# Patient Record
Sex: Female | Born: 1965 | Race: White | Hispanic: No | Marital: Single | Smoking: Former smoker
Health system: Southern US, Community
[De-identification: ages and names within clinical notes are randomized; demographics above are authoritative.]

## PROBLEM LIST (undated history)

## (undated) DIAGNOSIS — E119 Type 2 diabetes mellitus without complications: Secondary | ICD-10-CM

## (undated) DIAGNOSIS — I10 Essential (primary) hypertension: Secondary | ICD-10-CM

## (undated) DIAGNOSIS — N289 Disorder of kidney and ureter, unspecified: Secondary | ICD-10-CM

---

## 2017-04-16 ENCOUNTER — Inpatient Hospital Stay (HOSPITAL_BASED_OUTPATIENT_CLINIC_OR_DEPARTMENT_OTHER)
Admission: EM | Admit: 2017-04-16 | Discharge: 2017-04-22 | DRG: 854 | Disposition: A | Discharge status: Home Health | Payer: Medicaid Other | Attending: Internal Medicine | Admitting: Internal Medicine

## 2017-04-16 ENCOUNTER — Emergency Department (HOSPITAL_BASED_OUTPATIENT_CLINIC_OR_DEPARTMENT_OTHER): Payer: Medicaid Other

## 2017-04-16 ENCOUNTER — Inpatient Hospital Stay (HOSPITAL_BASED_OUTPATIENT_CLINIC_OR_DEPARTMENT_OTHER): Payer: Medicaid Other

## 2017-04-16 ENCOUNTER — Encounter (HOSPITAL_BASED_OUTPATIENT_CLINIC_OR_DEPARTMENT_OTHER): Payer: Self-pay | Admitting: Emergency Medicine

## 2017-04-16 DIAGNOSIS — F339 Major depressive disorder, recurrent, unspecified: Secondary | ICD-10-CM | POA: Diagnosis present

## 2017-04-16 DIAGNOSIS — K580 Irritable bowel syndrome with diarrhea: Secondary | ICD-10-CM | POA: Diagnosis present

## 2017-04-16 DIAGNOSIS — R197 Diarrhea, unspecified: Secondary | ICD-10-CM | POA: Diagnosis not present

## 2017-04-16 DIAGNOSIS — D6859 Other primary thrombophilia: Secondary | ICD-10-CM | POA: Diagnosis present

## 2017-04-16 DIAGNOSIS — L03111 Cellulitis of right axilla: Secondary | ICD-10-CM | POA: Diagnosis present

## 2017-04-16 DIAGNOSIS — N611 Abscess of the breast and nipple: Secondary | ICD-10-CM | POA: Diagnosis present

## 2017-04-16 DIAGNOSIS — Z1639 Resistance to other specified antimicrobial drug: Secondary | ICD-10-CM | POA: Diagnosis present

## 2017-04-16 DIAGNOSIS — N179 Acute kidney failure, unspecified: Secondary | ICD-10-CM | POA: Diagnosis present

## 2017-04-16 DIAGNOSIS — E876 Hypokalemia: Secondary | ICD-10-CM | POA: Diagnosis not present

## 2017-04-16 DIAGNOSIS — F410 Panic disorder [episodic paroxysmal anxiety] without agoraphobia: Secondary | ICD-10-CM | POA: Diagnosis present

## 2017-04-16 DIAGNOSIS — A419 Sepsis, unspecified organism: Secondary | ICD-10-CM

## 2017-04-16 DIAGNOSIS — Z79899 Other long term (current) drug therapy: Secondary | ICD-10-CM

## 2017-04-16 DIAGNOSIS — N048 Nephrotic syndrome with other morphologic changes: Secondary | ICD-10-CM | POA: Diagnosis not present

## 2017-04-16 DIAGNOSIS — Z87441 Personal history of nephrotic syndrome: Secondary | ICD-10-CM | POA: Diagnosis not present

## 2017-04-16 DIAGNOSIS — Z7901 Long term (current) use of anticoagulants: Secondary | ICD-10-CM

## 2017-04-16 DIAGNOSIS — I10 Essential (primary) hypertension: Secondary | ICD-10-CM | POA: Diagnosis present

## 2017-04-16 DIAGNOSIS — E8809 Other disorders of plasma-protein metabolism, not elsewhere classified: Secondary | ICD-10-CM | POA: Diagnosis present

## 2017-04-16 DIAGNOSIS — Z7984 Long term (current) use of oral hypoglycemic drugs: Secondary | ICD-10-CM

## 2017-04-16 DIAGNOSIS — L02411 Cutaneous abscess of right axilla: Secondary | ICD-10-CM | POA: Diagnosis present

## 2017-04-16 DIAGNOSIS — N049 Nephrotic syndrome with unspecified morphologic changes: Secondary | ICD-10-CM | POA: Diagnosis not present

## 2017-04-16 DIAGNOSIS — Z87891 Personal history of nicotine dependence: Secondary | ICD-10-CM

## 2017-04-16 DIAGNOSIS — N052 Unspecified nephritic syndrome with diffuse membranous glomerulonephritis: Secondary | ICD-10-CM | POA: Diagnosis present

## 2017-04-16 DIAGNOSIS — E1122 Type 2 diabetes mellitus with diabetic chronic kidney disease: Secondary | ICD-10-CM | POA: Diagnosis present

## 2017-04-16 DIAGNOSIS — N61 Mastitis without abscess: Secondary | ICD-10-CM | POA: Diagnosis not present

## 2017-04-16 DIAGNOSIS — L0390 Cellulitis, unspecified: Secondary | ICD-10-CM

## 2017-04-16 DIAGNOSIS — G4700 Insomnia, unspecified: Secondary | ICD-10-CM | POA: Diagnosis present

## 2017-04-16 DIAGNOSIS — A4102 Sepsis due to Methicillin resistant Staphylococcus aureus: Principal | ICD-10-CM | POA: Diagnosis present

## 2017-04-16 HISTORY — DX: Essential (primary) hypertension: I10

## 2017-04-16 HISTORY — DX: Disorder of kidney and ureter, unspecified: N28.9

## 2017-04-16 HISTORY — DX: Type 2 diabetes mellitus without complications: E11.9

## 2017-04-16 LAB — COMPREHENSIVE METABOLIC PANEL
ALT: 18 U/L (ref 14–54)
AST: 20 U/L (ref 15–41)
Albumin: 1.1 g/dL — ABNORMAL LOW (ref 3.5–5.0)
Alkaline Phosphatase: 109 U/L (ref 38–126)
Anion gap: 7 (ref 5–15)
BILIRUBIN TOTAL: 0.9 mg/dL (ref 0.3–1.2)
BUN: 37 mg/dL — ABNORMAL HIGH (ref 6–20)
CHLORIDE: 104 mmol/L (ref 101–111)
CO2: 27 mmol/L (ref 22–32)
CREATININE: 1.9 mg/dL — AB (ref 0.44–1.00)
Calcium: 7.5 mg/dL — ABNORMAL LOW (ref 8.9–10.3)
GFR, EST AFRICAN AMERICAN: 34 mL/min — AB (ref >60)
GFR, EST NON AFRICAN AMERICAN: 29 mL/min — AB (ref >60)
Glucose, Bld: 155 mg/dL — ABNORMAL HIGH (ref 65–99)
POTASSIUM: 3.5 mmol/L (ref 3.5–5.1)
Sodium: 138 mmol/L (ref 135–145)
TOTAL PROTEIN: 5.1 g/dL — AB (ref 6.5–8.1)

## 2017-04-16 LAB — PROTIME-INR
INR: 2.75
Prothrombin Time: 28.9 seconds — ABNORMAL HIGH (ref 11.4–15.2)

## 2017-04-16 LAB — CBC WITH DIFFERENTIAL/PLATELET
BAND NEUTROPHILS: 5 %
BASOS ABS: 0.1 10*3/uL (ref 0.0–0.1)
BASOS PCT: 1 %
EOS PCT: 3 %
Eosinophils Absolute: 0.4 10*3/uL (ref 0.0–0.7)
HEMATOCRIT: 40.1 % (ref 36.0–46.0)
HEMOGLOBIN: 13.3 g/dL (ref 12.0–15.0)
Lymphocytes Relative: 10 %
Lymphs Abs: 1.3 10*3/uL (ref 0.7–4.0)
MCH: 29.8 pg (ref 26.0–34.0)
MCHC: 33.2 g/dL (ref 30.0–36.0)
MCV: 89.9 fL (ref 78.0–100.0)
METAMYELOCYTES PCT: 1 %
MONOS PCT: 5 %
Monocytes Absolute: 0.6 10*3/uL (ref 0.1–1.0)
Myelocytes: 1 %
NEUTROS PCT: 74 %
Neutro Abs: 10.4 10*3/uL — ABNORMAL HIGH (ref 1.7–7.7)
PLATELETS: 253 10*3/uL (ref 150–400)
RBC: 4.46 MIL/uL (ref 3.87–5.11)
RDW: 16.3 % — ABNORMAL HIGH (ref 11.5–15.5)
WBC: 12.8 10*3/uL — AB (ref 4.0–10.5)

## 2017-04-16 LAB — GLUCOSE, CAPILLARY: GLUCOSE-CAPILLARY: 108 mg/dL — AB (ref 65–99)

## 2017-04-16 LAB — I-STAT CG4 LACTIC ACID, ED: LACTIC ACID, VENOUS: 0.97 mmol/L (ref 0.5–1.9)

## 2017-04-16 MED ORDER — PREDNISONE 10 MG PO TABS
10.0000 mg | ORAL_TABLET | Freq: Every day | ORAL | Status: DC
  Administered 2017-04-17 – 2017-04-22 (×5): 10 mg via ORAL
  Filled 2017-04-16 (×5): qty 1

## 2017-04-16 MED ORDER — DICYCLOMINE HCL 10 MG PO CAPS
10.0000 mg | ORAL_CAPSULE | Freq: Three times a day (TID) | ORAL | Status: DC
  Administered 2017-04-16 – 2017-04-21 (×16): 10 mg via ORAL
  Filled 2017-04-16 (×20): qty 1

## 2017-04-16 MED ORDER — TORSEMIDE 20 MG PO TABS: 20.0000 mg | ORAL_TABLET | Freq: Two times a day (BID) | ORAL | Status: DC

## 2017-04-16 MED ORDER — SPIRONOLACTONE 25 MG PO TABS: 25.0000 mg | ORAL_TABLET | Freq: Every day | ORAL | Status: DC

## 2017-04-16 MED ORDER — HYDROMORPHONE HCL 1 MG/ML IJ SOLN: 0.5000 mg | INTRAMUSCULAR | Status: DC | PRN

## 2017-04-16 MED ORDER — VANCOMYCIN HCL IN DEXTROSE 1-5 GM/200ML-% IV SOLN
1000.0000 mg | Freq: Once | INTRAVENOUS | Status: AC
  Administered 2017-04-16: 1000 mg via INTRAVENOUS
  Filled 2017-04-16: qty 200

## 2017-04-16 MED ORDER — VANCOMYCIN HCL IN DEXTROSE 1-5 GM/200ML-% IV SOLN
1000.0000 mg | INTRAVENOUS | Status: DC
  Administered 2017-04-17 – 2017-04-19 (×3): 1000 mg via INTRAVENOUS
  Filled 2017-04-16 (×4): qty 200

## 2017-04-16 MED ORDER — INSULIN ASPART 100 UNIT/ML ~~LOC~~ SOLN
0.0000 [IU] | Freq: Three times a day (TID) | SUBCUTANEOUS | Status: DC
  Administered 2017-04-17: 2 [IU] via SUBCUTANEOUS
  Administered 2017-04-17: 1 [IU] via SUBCUTANEOUS
  Administered 2017-04-18: 3 [IU] via SUBCUTANEOUS
  Administered 2017-04-19: 2 [IU] via SUBCUTANEOUS
  Administered 2017-04-19: 1 [IU] via SUBCUTANEOUS
  Administered 2017-04-19 – 2017-04-20 (×2): 2 [IU] via SUBCUTANEOUS
  Administered 2017-04-20: 1 [IU] via SUBCUTANEOUS
  Administered 2017-04-21: 2 [IU] via SUBCUTANEOUS
  Administered 2017-04-21 – 2017-04-22 (×2): 1 [IU] via SUBCUTANEOUS

## 2017-04-16 MED ORDER — ONDANSETRON HCL 4 MG PO TABS: 4.0000 mg | ORAL_TABLET | Freq: Four times a day (QID) | ORAL | Status: DC | PRN

## 2017-04-16 MED ORDER — ACETAMINOPHEN 500 MG PO TABS
1000.0000 mg | ORAL_TABLET | Freq: Once | ORAL | Status: AC
  Administered 2017-04-16: 1000 mg via ORAL
  Filled 2017-04-16: qty 2

## 2017-04-16 MED ORDER — ONDANSETRON HCL 4 MG/2ML IJ SOLN: 4.0000 mg | Freq: Four times a day (QID) | INTRAMUSCULAR | Status: DC | PRN

## 2017-04-16 MED ORDER — ACETAMINOPHEN 650 MG RE SUPP: 650.0000 mg | Freq: Four times a day (QID) | RECTAL | Status: DC | PRN

## 2017-04-16 MED ORDER — INSULIN ASPART 100 UNIT/ML ~~LOC~~ SOLN: 0.0000 [IU] | Freq: Every day | SUBCUTANEOUS | Status: DC

## 2017-04-16 MED ORDER — ACETAMINOPHEN 325 MG PO TABS
650.0000 mg | ORAL_TABLET | Freq: Four times a day (QID) | ORAL | Status: DC | PRN
  Administered 2017-04-20 – 2017-04-22 (×3): 650 mg via ORAL
  Filled 2017-04-16 (×3): qty 2

## 2017-04-16 MED ORDER — SODIUM CHLORIDE 0.9% FLUSH
3.0000 mL | Freq: Two times a day (BID) | INTRAVENOUS | Status: DC
  Administered 2017-04-16 – 2017-04-22 (×11): 3 mL via INTRAVENOUS

## 2017-04-16 MED ORDER — TACROLIMUS 1 MG PO CAPS
1.0000 mg | ORAL_CAPSULE | Freq: Two times a day (BID) | ORAL | Status: DC
  Administered 2017-04-16 – 2017-04-17 (×2): 1 mg via ORAL
  Filled 2017-04-16 (×3): qty 1

## 2017-04-16 MED ORDER — HYDROCODONE-ACETAMINOPHEN 5-325 MG PO TABS: 1.0000 | ORAL_TABLET | ORAL | Status: DC | PRN

## 2017-04-16 MED ORDER — QUETIAPINE FUMARATE 25 MG PO TABS
25.0000 mg | ORAL_TABLET | Freq: Every day | ORAL | Status: DC
  Administered 2017-04-16 – 2017-04-21 (×6): 25 mg via ORAL
  Filled 2017-04-16 (×6): qty 1

## 2017-04-16 MED ORDER — FLUOXETINE HCL 10 MG PO CAPS
10.0000 mg | ORAL_CAPSULE | Freq: Every day | ORAL | Status: DC
  Administered 2017-04-17 – 2017-04-22 (×5): 10 mg via ORAL
  Filled 2017-04-16 (×6): qty 1

## 2017-04-16 MED ORDER — HYDROCODONE-ACETAMINOPHEN 5-325 MG PO TABS
1.0000 | ORAL_TABLET | ORAL | Status: DC | PRN
  Administered 2017-04-16 – 2017-04-20 (×10): 1 via ORAL
  Administered 2017-04-21: 2 via ORAL
  Filled 2017-04-16 (×4): qty 1
  Filled 2017-04-16: qty 2
  Filled 2017-04-16 (×6): qty 1

## 2017-04-16 NOTE — Progress Notes (Signed)
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   Medical History: Past Medical History:  Diagnosis Date  . Diabetes mellitus without complication (HCC)   . Hypertension   . Renal disorder     [name] presents to the ED with right breast cellulitis, on warfarin PTA for hypercoagulable state, INR is therapeutic on admit at 2.75, CBC good.   Warfarin PTA dosing: 5 mg daily   Goal of Therapy:  INR 2-3 Monitor platelets by anticoagulation protocol: Yes   Plan:  Warfarin 5 mg daily at 1800 Daily PT/INR Monitor for bleeding   Abran DukeLedford, Lenord Fralix 04/16/2017,11:46 PM

## 2017-04-16 NOTE — ED Provider Notes (Signed)
[facility] EMERGENCY DEPARTMENT Provider Note   CSN: [id number] Arrival date & time: 04/16/17  1549     History   Chief Complaint Chief Complaint  Patient presents with  . Abscess  . Fever    HPI Oreoluwa Gilmer is a 51 y.o. female.  Patient is a 51 year old female with multiple medical problems including recent diagnosis of nephrotic syndrome on immunosuppressants and steroids, diabetes, anasarca, chronic diarrhea who was recently admitted to [facility] and discharged only a few days ago presenting today with severe pain in her right breast, redness and drainage.  Patient states while hospitalized she had weeping sores on the upper portion of her right breast but the significant pain swelling and redness started since being home.  She is also running a fever today but is unclear how long that has been happening.  She denies any shortness of breath but states it is very difficult for her to get around because of the excessive weight from the generalized edema and intermittently friends states she will be confused.  She denies any urinary complaints but states that she does not urinate as much as normal because she is having excessive watery diarrhea which she has almost hourly.  While hospitalized diarrhea was addressed and it was thought to be related to the cyclosporine and magnesium oxide she was taking so she was changed to Prograft however that has not seemed to improve her diarrhea.  She also tested negative for C. difficile.   The history is provided by the patient and a friend.  Abscess  Location:  Torso Torso abscess location:  R breast Size:  Large Abscess quality: draining, painful, redness, warmth and weeping   Red streaking: yes   Duration:  3 days Progression:  Worsening Pain details:    Quality:  Sharp and shooting   Severity:  Severe   Timing:  Constant   Progression:  Worsening Chronicity:  New Context: diabetes and immunosuppression     Relieved by:  Nothing Associated symptoms: fatigue and fever   Associated symptoms: no nausea and no vomiting   Fever   Pertinent negatives include no vomiting.    No past medical history on file.  Patient Active Problem List   Diagnosis Date Noted  . Breast abscess 04/16/2017    No past surgical history on file.  OB History    No data available       Home Medications    Prior to Admission medications   Medication Sig Start Date End Date Taking? Authorizing Provider  dicyclomine (BENTYL) 10 MG capsule Take 10 mg by mouth 4 (four) times daily -  before meals and at bedtime.   Yes [provider]  FLUoxetine (PROZAC) 10 MG capsule Take 10 mg by mouth daily.   Yes [provider]  glipiZIDE (GLUCOTROL) 5 MG tablet Take 5 mg by mouth daily before breakfast.   Yes [provider]  losartan (COZAAR) 50 MG tablet Take 50 mg by mouth daily.   Yes [provider]  ondansetron (ZOFRAN) 4 MG tablet Take 4 mg by mouth every 8 (eight) hours as needed for nausea or vomiting.   Yes [provider]  predniSONE (DELTASONE) 50 MG tablet Take 10 mg by mouth daily with breakfast.   Yes [provider]  QUEtiapine (SEROQUEL) 25 MG tablet Take 25 mg by mouth at bedtime.   Yes [provider]  spironolactone (ALDACTONE) 25 MG tablet Take 25 mg by mouth daily.  Yes [provider]  tacrolimus (PROGRAF) 1 MG capsule Take 1 mg by mouth 2 (two) times daily.   Yes [provider]  torsemide (DEMADEX) 20 MG tablet Take 20 mg by mouth 2 (two) times daily.   Yes [provider]  warfarin (COUMADIN) 5 MG tablet Take 5 mg by mouth daily.   Yes [provider]    Family History No family history on file.  Social History Social History  Substance Use Topics  . Smoking status: Not on file  . Smokeless tobacco: Not on file  . Alcohol use Not on file     Allergies   Patient has no known  allergies.   Review of Systems Review of Systems  Constitutional: Positive for fatigue and fever.  Gastrointestinal: Negative for nausea and vomiting.  All other systems reviewed and are negative.    Physical Exam Updated Vital Signs BP (!) 152/81   Pulse 94   Temp 100.3 F (37.9 C) (Oral)   Resp 20   Ht 5\' 4"  (1.626 m)   Wt 105.2 kg (232 lb)   SpO2 97%   BMI 39.82 kg/m   Physical Exam  Constitutional: She is oriented to person, place, and time. She appears well-developed and well-nourished. No distress.  HENT:  Head: Normocephalic and atraumatic.  Mouth/Throat: Oropharynx is clear and moist. Mucous membranes are dry.  Eyes: Pupils are equal, round, and reactive to light. Conjunctivae and EOM are normal.  Neck: Normal range of motion. Neck supple.  Cardiovascular: Normal rate, regular rhythm and intact distal pulses.   No murmur heard. Pulmonary/Chest: Effort normal. No respiratory distress. She has decreased breath sounds in the right lower field and the left lower field. She has no wheezes. She has no rales.  Abdominal: Soft. She exhibits no distension. There is no tenderness. There is no rebound and no guarding.  Musculoskeletal: Normal range of motion. She exhibits edema. She exhibits no tenderness.  Diffuse anasarca present in the upper and lower extremities and abdomen  Neurological: She is alert and oriented to person, place, and time.  Skin: Skin is warm and dry. No rash noted. No erythema.  Large abscess present over the upper portion of the right breast with surrounding erythema, warmth.  No drainage present.  Patient also has small pustules and vesicles in the same area concerning for potential shingles.  Nothing present on the left side or the back.  Psychiatric: She has a normal mood and affect. Her behavior is normal.  Nursing note and vitals reviewed.    ED Treatments / Results  Labs (all labs ordered are listed, but only abnormal results are  displayed) Labs Reviewed  COMPREHENSIVE METABOLIC PANEL - Abnormal; Notable for the following:       Result Value   Glucose, Bld 155 (*)    BUN 37 (*)    Creatinine, Ser 1.90 (*)    Calcium 7.5 (*)    Total Protein 5.1 (*)    Albumin 1.1 (*)    GFR calc non Af Amer 29 (*)    GFR calc Af Amer 34 (*)    All other components within normal limits  CBC WITH DIFFERENTIAL/PLATELET - Abnormal; Notable for the following:    WBC 12.8 (*)    RDW 16.3 (*)    Neutro Abs 10.4 (*)    All other components within normal limits  CULTURE, BLOOD (ROUTINE X 2)  CULTURE, BLOOD (ROUTINE X 2)  URINALYSIS, ROUTINE W REFLEX MICROSCOPIC  I-STAT  CG4 LACTIC ACID, ED  I-STAT CG4 LACTIC ACID, ED    EKG  EKG Interpretation None       Radiology Dg Chest 2 View  Result Date: 04/16/2017 CLINICAL DATA:  Fever with abscess to the right axilla EXAM: CHEST  2 VIEW COMPARISON:  None. FINDINGS: Small left greater than right pleural effusion. Hazy atelectasis or infiltrate at the left base. Normal heart size. No pneumothorax. Old right fifth rib fracture IMPRESSION: Small left greater than right pleural effusion with left basilar atelectasis or infiltrate Electronically Signed   By: Jasmine PangKim  Fujinaga M.D.   On: 04/16/2017 17:10    Procedures Procedures (including critical care time)  Medications Ordered in ED Medications  vancomycin (VANCOCIN) IVPB 1000 mg/200 mL premix (1,000 mg Intravenous New Bag/Given 04/16/17 1832)     Initial Impression / Assessment and Plan / ED Course  I have reviewed the triage vital signs and the nursing notes.  Pertinent labs & imaging results that were available during my care of the patient were reviewed by me and considered in my medical decision making (see chart for details).     Patient presenting with complex medical history who is now here with a breast abscess and cellulitis.  Septic workup initiated.  She is lactic acid is within normal limits.  Leukocytosis of 12.8,  new acute kidney injury on top of chronic kidney disease with creatinine of 1.9 from her baseline of 1.1.  Chest x-rays showing small pleural effusions with some atelectasis.  Given the size and extent of the breast abscess and cellulitis patient started on vancomycin.  Spoke with Dr. Derrell Lollingamirez who recommended a noncontrast CAT scan to get further formation about the size of the abscess.  Spoke with Dr. Antionette Charpyd for admission.  Final Clinical Impressions(s) / ED Diagnoses   Final diagnoses:  Breast abscess  Cellulitis, unspecified cellulitis site  Sepsis, due to unspecified organism (HCC)  Acute kidney injury (HCC)  Nephrotic syndrome    New Prescriptions New Prescriptions   No medications on file     Gwyneth SproutPlunkett, Willies Laviolette, MD 04/16/17 1952

## 2017-04-16 NOTE — ED Notes (Signed)
Pt unable to provide urine sample at this time.  Pt on automatic VS.

## 2017-04-16 NOTE — Consult Note (Signed)
Reason for Consult: Right breast abscess Referring Physician: Dr. [name] is an 51 y.o. female.  HPI: Patient is a 51 year old female with a past medical history is significant for hypertension, diabetes, renal disorder on chronic immunosuppression. Patient states that recently she was discharged from the hospital at [facility] secondary to nephrotic-like syndrome.  Patient was weaned off of prednisone.  She states that she had some tenderness at the time of discharge of the right axillary and right breast area.  She states that over the last 2 days she had some more significant developments.  She states that she had some continued erythema develop as well as some weeping from the right breast area.  Patient also states there was some tingling and burning sensation to the right area of concern.  Secondary to this she presented to the outside ED for further evaluation and management.  Secondary to her kidney issues she underwent transfer to skilled hospital.  Patient underwent CT scan which revealed no collection of abscess.  Of note the patient does states she had a episode of chickenpox as a child.  Past Medical History:  Diagnosis Date  . Diabetes mellitus without complication ([place])   . Hypertension   . Renal disorder     History reviewed. No pertinent surgical history.  No family history on file.  Social History:  reports that she quit smoking about 8 years ago. She has never used smokeless tobacco. She reports that she drinks alcohol. She reports that she does not use drugs.  Allergies: No Known Allergies  Medications: I have reviewed the patient's current medications.  Results for orders placed or performed during the hospital encounter of 04/16/17 (from the past 48 hour(s))  Comprehensive metabolic panel     Status: Abnormal   Collection Time: 04/16/17  4:28 PM  Result Value Ref Range   Sodium 138 135 - 145 mmol/L   Potassium 3.5 3.5 - 5.1 mmol/L   Chloride 104 101 - 111 mmol/L   CO2 27 22 - 32 mmol/L   Glucose, Bld 155 (H) 65 - 99 mg/dL   BUN 37 (H) 6 - 20 mg/dL   Creatinine, Ser 1.90 (H) 0.44 - 1.00 mg/dL   Calcium 7.5 (L) 8.9 - 10.3 mg/dL   Total Protein 5.1 (L) 6.5 - 8.1 g/dL   Albumin 1.1 (L) 3.5 - 5.0 g/dL   AST 20 15 - 41 U/L   ALT 18 14 - 54 U/L   Alkaline Phosphatase 109 38 - 126 U/L   Total Bilirubin 0.9 0.3 - 1.2 mg/dL   GFR calc non Af Amer 29 (L) >60 mL/min   GFR calc Af Amer 34 (L) >60 mL/min    Comment: (NOTE) The eGFR has been calculated using the CKD EPI equation. This calculation has not been validated in all clinical situations. eGFR's persistently <60 mL/min signify possible Chronic Kidney Disease.    Anion gap 7 5 - 15  CBC with Differential     Status: Abnormal   Collection Time: 04/16/17  4:28 PM  Result Value Ref Range   WBC 12.8 (H) 4.0 - 10.5 K/uL   RBC 4.46 3.87 - 5.11 MIL/uL   Hemoglobin 13.3 12.0 - 15.0 g/dL   HCT 40.1 36.0 - 46.0 %   MCV 89.9 78.0 - 100.0 fL   MCH 29.8 26.0 - 34.0 pg   MCHC 33.2 30.0 - 36.0 g/dL   RDW 16.3 (H) 11.5 - 15.5 %   Platelets 253  150 - 400 K/uL   Neutrophils Relative % 74 %   Lymphocytes Relative 10 %   Monocytes Relative 5 %   Eosinophils Relative 3 %   Basophils Relative 1 %   Band Neutrophils 5 %   Metamyelocytes Relative 1 %   Myelocytes 1 %   Neutro Abs 10.4 (H) 1.7 - 7.7 K/uL   Lymphs Abs 1.3 0.7 - 4.0 K/uL   Monocytes Absolute 0.6 0.1 - 1.0 K/uL   Eosinophils Absolute 0.4 0.0 - 0.7 K/uL   Basophils Absolute 0.1 0.0 - 0.1 K/uL   WBC Morphology VACUOLATED NEUTROPHILS   I-Stat CG4 Lactic Acid, ED     Status: None   Collection Time: 04/16/17  4:51 PM  Result Value Ref Range   Lactic Acid, Venous 0.97 0.5 - 1.9 mmol/L    Dg Chest 2 View  Result Date: 04/16/2017 CLINICAL DATA:  Fever with abscess to the right axilla EXAM: CHEST  2 VIEW COMPARISON:  None. FINDINGS: Small left greater than right pleural effusion. Hazy atelectasis or infiltrate at the  left base. Normal heart size. No pneumothorax. Old right fifth rib fracture IMPRESSION: Small left greater than right pleural effusion with left basilar atelectasis or infiltrate Electronically Signed   By: Donavan Foil M.D.   On: 04/16/2017 17:10   Ct Chest Wo Contrast  Result Date: 04/16/2017 CLINICAL DATA:  Chest wall pain. Recent diagnosis of nephrotic syndrome. Patient on immunosuppressants and steroids. Presents today with severe pain in her right breast with redness and drainage. EXAM: CT CHEST WITHOUT CONTRAST TECHNIQUE: Multidetector CT imaging of the chest was performed following the standard protocol without IV contrast. COMPARISON:  Chest x-ray from earlier today FINDINGS: Cardiovascular: Coronary artery calcifications are identified. The heart size is normal. The thoracic aorta demonstrates mild atherosclerotic change. No aneurysm identified. Central pulmonary arteries are normal in caliber. Mediastinum/Nodes: Small bilateral pleural effusions are seen. A tiny amount of pericardial fluid is noted. The thyroid and esophagus are normal. No adenopathy in the mediastinum or hila. Increased subcutaneous edema identified. There is skin thickening associated with both breasts. No definitive abscess or mass is seen. Lungs/Pleura: Central airways are normal. No pneumothorax. No overt edema. Atelectasis underlies the bilateral effusions. No suspicious infiltrate to suggest pneumonia. No suspicious nodules or masses. Upper Abdomen: No acute abnormality. Musculoskeletal: No chest wall mass or suspicious bone lesions identified. IMPRESSION: 1. The patient's symptoms of breast pain with redness and drainage could be seen with edema, mastitis, or inflammatory breast cancer. No obvious abscess or mass seen on today's imaging. However, mammography and ultrasound would be the studies of choice rather than CT. Recommend referral to the [facility] for further evaluation. 2. Pleural effusions and subcutaneous edema  likely represent volume overload. No pulmonary edema identified. 3. Coronary artery calcifications. Mild atherosclerotic change in the thoracic aorta. 4. No other acute abnormalities. Aortic Atherosclerosis (ICD10-I70.0). Electronically Signed   By: Dorise Bullion III M.D   On: 04/16/2017 20:53    Review of Systems  Constitutional: Negative for chills, fever and malaise/fatigue.  HENT: Negative for ear discharge, hearing loss and sore throat.   Eyes: Negative for blurred vision and discharge.  Respiratory: Negative for cough and shortness of breath.   Cardiovascular: Negative for chest pain, orthopnea and leg swelling.  Gastrointestinal: Negative for abdominal pain, constipation, diarrhea, heartburn, nausea and vomiting.  Musculoskeletal: Negative for myalgias and neck pain.  Skin: Positive for itching. Negative for rash.       Pain to  R ax and breast area   Neurological: Negative for dizziness, focal weakness, seizures and loss of consciousness.  Endo/Heme/Allergies: Negative for environmental allergies. Does not bruise/bleed easily.  Psychiatric/Behavioral: Negative for depression and suicidal ideas.  All other systems reviewed and are negative.  Blood pressure 126/81, pulse (!) 101, temperature (!) 100.6 F (38.1 C), temperature source Oral, resp. rate 20, height 5' 4"  (1.626 m), weight 103.8 kg (228 lb 14.4 oz), SpO2 98 %. Physical Exam  Constitutional: She is oriented to person, place, and time. Vital signs are normal. She appears well-developed and well-nourished.  Conversant No acute distress  Eyes: Lids are normal. No scleral icterus.  No lid lag Moist conjunctiva  Neck: No tracheal tenderness present. No thyromegaly present.  No cervical lymphadenopathy  Cardiovascular: Normal rate, regular rhythm and intact distal pulses.   No murmur heard. Respiratory: Effort normal and breath sounds normal. She has no wheezes. She has no rales.    GI: Bowel sounds are normal. She  exhibits no mass. There is no hepatosplenomegaly. There is no tenderness. There is no rebound and no guarding. No hernia.  Neurological: She is alert and oriented to person, place, and time.  Normal gait and station  Skin: Skin is warm. No rash noted. No cyanosis. Nails show no clubbing.  Normal skin turgor  Psychiatric: Judgment normal.  Appropriate affect    Assessment/Plan: 51 year old female with right-sided cellulitis, possible mastitis, possible shingles. Past Medical History:  Diagnosis Date  . Diabetes mellitus without complication ([place])   . Hypertension   . Renal disorder      1.  At this time would recommend general antibiotics for cellulitis. 2.  This possibility patient could have shingles that she has a history of having chickenpox as a child. 3.  Patient would benefit from a dedicated ultrasound of the right breast to evaluate for possible abscess. 4.  We will follow along.  Rosario Jacks., Gail King 04/16/2017, 9:45 PM

## 2017-04-16 NOTE — Progress Notes (Signed)
Pharmacy Antibiotic Note Gail King is a 51 y.o. female admitted on 04/16/2017 with cellulitis.  Pharmacy has been consulted for vancomycin dosing.  Plan: Vancomycin 1000 IV every 24 hours.  Goal trough 10-15 mcg/mL.  Height: 5\' 4"  (162.6 cm) Weight: 228 lb 14.4 oz (103.8 kg) IBW/kg (Calculated) : 54.7  Temp (24hrs), Avg:100.5 F (38.1 C), Min:100.3 F (37.9 C), Max:100.6 F (38.1 C)   Recent Labs Lab 04/16/17 1628 04/16/17 1651  WBC 12.8*  --   CREATININE 1.90*  --   LATICACIDVEN  --  0.97    Estimated Creatinine Clearance: 40.6 mL/min (A) (by C-G formula based on SCr of 1.9 mg/dL (H)).    No Known Allergies   Thank you for allowing pharmacy to be a part of this patient's care.  Pollyann SamplesAndy Dorsey Charette, PharmD, BCPS 04/16/2017, 10:12 PM

## 2017-04-16 NOTE — ED Triage Notes (Signed)
Abscess to R axilla, also reports swelling, weakness, diarrhea. [facility] and started on multiple new medicines.

## 2017-04-16 NOTE — H&P (Addendum)
History and Physical    Eeva Schlosser ZOX:[id number] DOB: 1965-12-27 DOA: 04/16/2017  PCP: System, Pcp Not In   Patient coming from: [facility]   Chief Complaint: Right breast redness, pain, and drainage. Fevers  HPI: Gail King is a 51 y.o. 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  Patient had been admitted to [facility] from 03/28/2017 until 04/13/2017 for IV diuresis as she had developed anasarca, likely secondary to nephropathy with serum albumin of 1.1.  She underwent IV diuresis and had a mild increase in her creatinine at that time.  She had profuse watery diarrhea with negative C. difficile testing, attributed to cyclosporine and magnesium oxide.  Cyclosporine was changed to Prograf and her diuretics were adjusted in light of the worsening renal function.  She was discharged in much improved and stable condition, but has since developed fevers and chills with purulent drainage from the upper right breast.  Reports continued adherence with her medications.  [facility] ED Course: Upon arrival to the ED, patient is found to be febrile to 38.1 degree C, saturating well on room air, tachycardic in the 110s, and with vitals otherwise stable.  Chemistry panel is notable for a BUN of 37 and creatinine 1.90, up from 1.5 at time of recent hospital discharge.  CBC is notable for leukocytosis 12,800 and lactic acid is reassuring at 0.97.  Patient was treated with vancomycin in the emergency department and general surgery was consulted by the ED physician.  Patient remained hemodynamically stable and in no apparent respiratory distress, and she will be admitted to the telemetry unit at [facility] for ongoing evaluation and management of right  upper breast cellulitis.  Review of Systems:  All other systems reviewed and apart from HPI, are negative.  Past Medical History:  Diagnosis Date  . Diabetes mellitus without complication (HCC)   . Hypertension   . Renal disorder     History reviewed. No pertinent surgical history.   reports that she quit smoking about 8 years ago. She has never used smokeless tobacco. She reports that she drinks alcohol. She reports that she does not use drugs.  No Known Allergies  History reviewed. No pertinent family history.   Prior to Admission medications   Medication Sig Start Date End Date Taking? Authorizing Provider  dicyclomine (BENTYL) 10 MG capsule Take 10 mg by mouth 4 (four) times daily -  before meals and at bedtime.   Yes [provider]  FLUoxetine (PROZAC) 10 MG capsule Take 10 mg by mouth daily.   Yes [provider]  glipiZIDE (GLUCOTROL) 5 MG tablet Take 5 mg by mouth daily before breakfast.   Yes [provider]  losartan (COZAAR) 50 MG tablet Take 50 mg by mouth daily.   Yes [provider]  ondansetron (ZOFRAN) 4 MG tablet Take 4 mg by mouth every 8 (eight) hours as needed for nausea or vomiting.   Yes [provider]  predniSONE (DELTASONE) 50 MG tablet Take 10 mg by mouth daily with breakfast.   Yes [provider]  QUEtiapine (SEROQUEL) 25 MG tablet Take 25 mg by mouth at bedtime.   Yes [provider]  spironolactone (ALDACTONE) 25 MG tablet Take 25 mg by mouth daily.   Yes [provider]  tacrolimus (PROGRAF) 1  MG capsule Take 1 mg by mouth 2 (two) times daily.   Yes [provider]  torsemide (DEMADEX) 20 MG tablet Take 20 mg by mouth 2 (two) times daily.   Yes [provider]  warfarin (COUMADIN) 5 MG tablet Take 5 mg by mouth daily.   Yes [provider]    Physical Exam: Vitals:   04/16/17 1948 04/16/17 2015 04/16/17 2030 04/16/17 2137  BP:  (!) 143/76 126/81     Pulse:  (!) 101    Resp:      Temp: (!) 100.6 F (38.1 C)     TempSrc: Oral     SpO2:  98%    Weight:    103.8 kg (228 lb 14.4 oz)  Height:    5\' 4"  (1.626 m)      Constitutional: NAD, calm, obese, in obvious discomfort Eyes: PERTLA, lids and conjunctivae normal ENMT: Mucous membranes are moist. Posterior pharynx clear of any exudate or lesions.   Neck: normal, supple, no masses, no thyromegaly Respiratory: clear to auscultation bilaterally, no wheezing, no crackles. Normal respiratory effort.   Cardiovascular: S1 & S2 heard, regular rate and rhythm. Diffuse edema. JVP not well-visualized. Abdomen: No distension, no tenderness, no masses palpated. Bowel sounds normal.  Musculoskeletal: no clubbing / cyanosis. No joint deformity upper and lower extremities.   Skin: no significant rashes, lesions, ulcers. Edematous. Right axilla/upper-outer breast with ulcerated nodule, surrounding pustules.  Neurologic: CN 2-12 grossly intact. Sensation intact. Strength 5/5 in all 4 limbs.  Psychiatric: Alert and oriented x 3. Calm, cooperative.     Labs on Admission: I have personally reviewed following labs and imaging studies  CBC:  Recent Labs Lab 04/16/17 1628  WBC 12.8*  NEUTROABS 10.4*  HGB 13.3  HCT 40.1  MCV 89.9  PLT 253   Basic Metabolic Panel:  Recent Labs Lab 04/16/17 1628  NA 138  K 3.5  CL 104  CO2 27  GLUCOSE 155*  BUN 37*  CREATININE 1.90*  CALCIUM 7.5*   GFR: Estimated Creatinine Clearance: 40.6 mL/min (A) (by C-G formula based on SCr of 1.9 mg/dL (H)). Liver Function Tests:  Recent Labs Lab 04/16/17 1628  AST 20  ALT 18  ALKPHOS 109  BILITOT 0.9  PROT 5.1*  ALBUMIN 1.1*   No results for input(s): LIPASE, AMYLASE in the last 168 hours. No results for input(s): AMMONIA in the last 168 hours. Coagulation Profile: No results for input(s): INR, PROTIME in the last 168 hours. Cardiac Enzymes: No results for input(s): CKTOTAL, CKMB, CKMBINDEX,  TROPONINI in the last 168 hours. BNP (last 3 results) No results for input(s): PROBNP in the last 8760 hours. HbA1C: No results for input(s): HGBA1C in the last 72 hours. CBG: No results for input(s): GLUCAP in the last 168 hours. Lipid Profile: No results for input(s): CHOL, HDL, LDLCALC, TRIG, CHOLHDL, LDLDIRECT in the last 72 hours. Thyroid Function Tests: No results for input(s): TSH, T4TOTAL, FREET4, T3FREE, THYROIDAB in the last 72 hours. Anemia Panel: No results for input(s): VITAMINB12, FOLATE, FERRITIN, TIBC, IRON, RETICCTPCT in the last 72 hours. Urine analysis: No results found for: COLORURINE, APPEARANCEUR, LABSPEC, PHURINE, GLUCOSEU, HGBUR, BILIRUBINUR, KETONESUR, PROTEINUR, UROBILINOGEN, NITRITE, LEUKOCYTESUR Sepsis Labs: @LABRCNTIP (procalcitonin:4,lacticidven:4) )No results found for this or any previous visit (from the past 240 hour(s)).   Radiological Exams on Admission: Dg Chest 2 View  Result Date: 04/16/2017 CLINICAL DATA:  Fever with abscess to the right axilla EXAM: CHEST  2 VIEW COMPARISON:  None. FINDINGS: Small left  greater than right pleural effusion. Hazy atelectasis or infiltrate at the left base. Normal heart size. No pneumothorax. Old right fifth rib fracture IMPRESSION: Small left greater than right pleural effusion with left basilar atelectasis or infiltrate Electronically Signed   By: Jasmine Pang M.D.   On: 04/16/2017 17:10   Ct Chest Wo Contrast  Result Date: 04/16/2017 CLINICAL DATA:  Chest wall pain. Recent diagnosis of nephrotic syndrome. Patient on immunosuppressants and steroids. Presents today with severe pain in her right breast with redness and drainage. EXAM: CT CHEST WITHOUT CONTRAST TECHNIQUE: Multidetector CT imaging of the chest was performed following the standard protocol without IV contrast. COMPARISON:  Chest x-ray from earlier today FINDINGS: Cardiovascular: Coronary artery calcifications are identified. The heart size is normal. The  thoracic aorta demonstrates mild atherosclerotic change. No aneurysm identified. Central pulmonary arteries are normal in caliber. Mediastinum/Nodes: Small bilateral pleural effusions are seen. A tiny amount of pericardial fluid is noted. The thyroid and esophagus are normal. No adenopathy in the mediastinum or hila. Increased subcutaneous edema identified. There is skin thickening associated with both breasts. No definitive abscess or mass is seen. Lungs/Pleura: Central airways are normal. No pneumothorax. No overt edema. Atelectasis underlies the bilateral effusions. No suspicious infiltrate to suggest pneumonia. No suspicious nodules or masses. Upper Abdomen: No acute abnormality. Musculoskeletal: No chest wall mass or suspicious bone lesions identified. IMPRESSION: 1. The patient's symptoms of breast pain with redness and drainage could be seen with edema, mastitis, or inflammatory breast cancer. No obvious abscess or mass seen on today's imaging. However, mammography and ultrasound would be the studies of choice rather than CT. Recommend referral to the Breast Center for further evaluation. 2. Pleural effusions and subcutaneous edema likely represent volume overload. No pulmonary edema identified. 3. Coronary artery calcifications. Mild atherosclerotic change in the thoracic aorta. 4. No other acute abnormalities. Aortic Atherosclerosis (ICD10-I70.0). Electronically Signed   By: Gerome Sam III M.D   On: 04/16/2017 20:53    EKG: Not performed.   Assessment/Plan  1. Right breast cellulitis - Pt presents with fever/chills and and erythema, pain, and drainage from upper-outer right breast - CT negative for abscess, appreciate surgery involvement, will check dedicated US for drainable collection  - Cancer remains on differential  - Blood cultures collected in ED and empiric vancomycin given; will continue vancomycin given the systemic features and immunosuppression   2. Membranous nephropathy,  anasarca   - Confirmed with biopsy  - Pt just discharged from outside hospital on 10/25 after a protracted stay for anasarca   - SCr has worsened from 1.5 at discharge, to 1.9 at time of admission  - Continue Prograf and prednisone, continue warfarin for associated hypercoagulable state  - Given the acute infectious presentation with tachycardia, and in light of worsened renal fxn, will hold diuretics initialy; may need albumin with diuresis as serum albumin only 1.1   3. Acute kidney injury  - SCr is 1.9 on admission, was 1.5 at time of d/c from [facility] on 10/25, 1.1 when admitted to [facility] on 10/9 - Likely secondary to continued watery diarrhea, diuretics - Hold diuretics and losartan initially   4. Watery diarrhea  - Had negative C diff testing during recent hospitalization  - Was attributed to mag-oxide and cyclosporine, which was switched to Prograf  - Watery diarrhea persists, will check GI pathogen panel, monitor and replete lytes, continue Bentyl  5. Major depression, insomnia  - Continue Seroquel qHS    DVT prophylaxis: warfarin Code Status:  Full  Family Communication: Discussed with patient  Disposition Plan: Admit to telemetry Consults called: Surgery Admission status: Inpatient    Briscoe Deutscherimothy S Daulton Harbaugh, MD [facility] Pager [phone]  If 7PM-7AM, please contact night-coverage [email or web address] Password [email or web address]  04/16/2017, 10:04 PM

## 2017-04-16 NOTE — ED Notes (Signed)
Patient transported to X-ray 

## 2017-04-17 ENCOUNTER — Inpatient Hospital Stay (HOSPITAL_COMMUNITY): Payer: Medicaid Other

## 2017-04-17 LAB — URINALYSIS, ROUTINE W REFLEX MICROSCOPIC
BACTERIA UA: NONE SEEN
Bilirubin Urine: NEGATIVE
GLUCOSE, UA: 150 mg/dL — AB
HGB URINE DIPSTICK: NEGATIVE
KETONES UR: NEGATIVE mg/dL
LEUKOCYTES UA: NEGATIVE
Nitrite: NEGATIVE
Specific Gravity, Urine: 1.023 (ref 1.005–1.030)
pH: 9 — ABNORMAL HIGH (ref 5.0–8.0)

## 2017-04-17 LAB — COMPREHENSIVE METABOLIC PANEL
ALK PHOS: 83 U/L (ref 38–126)
ALT: 12 U/L — AB (ref 14–54)
AST: 12 U/L — AB (ref 15–41)
Anion gap: 9 (ref 5–15)
BUN: 36 mg/dL — AB (ref 6–20)
CALCIUM: 7 mg/dL — AB (ref 8.9–10.3)
CHLORIDE: 107 mmol/L (ref 101–111)
CO2: 24 mmol/L (ref 22–32)
CREATININE: 2.26 mg/dL — AB (ref 0.44–1.00)
GFR calc Af Amer: 28 mL/min — ABNORMAL LOW (ref >60)
GFR calc non Af Amer: 24 mL/min — ABNORMAL LOW (ref >60)
GLUCOSE: 93 mg/dL (ref 65–99)
Potassium: 3.2 mmol/L — ABNORMAL LOW (ref 3.5–5.1)
SODIUM: 140 mmol/L (ref 135–145)
Total Bilirubin: 0.9 mg/dL (ref 0.3–1.2)
Total Protein: 4.1 g/dL — ABNORMAL LOW (ref 6.5–8.1)

## 2017-04-17 LAB — CBC WITH DIFFERENTIAL/PLATELET
BASOS ABS: 0 10*3/uL (ref 0.0–0.1)
BASOS PCT: 0 %
EOS ABS: 0.1 10*3/uL (ref 0.0–0.7)
Eosinophils Relative: 1 %
HCT: 34 % — ABNORMAL LOW (ref 36.0–46.0)
HEMOGLOBIN: 11.3 g/dL — AB (ref 12.0–15.0)
LYMPHS PCT: 12 %
Lymphs Abs: 1.2 10*3/uL (ref 0.7–4.0)
MCH: 29.8 pg (ref 26.0–34.0)
MCHC: 33.2 g/dL (ref 30.0–36.0)
MCV: 89.7 fL (ref 78.0–100.0)
Monocytes Absolute: 0.9 10*3/uL (ref 0.1–1.0)
Monocytes Relative: 9 %
NEUTROS PCT: 78 %
Neutro Abs: 7.7 10*3/uL (ref 1.7–7.7)
PLATELETS: 223 10*3/uL (ref 150–400)
RBC: 3.79 MIL/uL — AB (ref 3.87–5.11)
RDW: 16.6 % — ABNORMAL HIGH (ref 11.5–15.5)
WBC: 9.9 10*3/uL (ref 4.0–10.5)

## 2017-04-17 LAB — GLUCOSE, CAPILLARY
GLUCOSE-CAPILLARY: 101 mg/dL — AB (ref 65–99)
GLUCOSE-CAPILLARY: 171 mg/dL — AB (ref 65–99)
Glucose-Capillary: 138 mg/dL — ABNORMAL HIGH (ref 65–99)
Glucose-Capillary: 145 mg/dL — ABNORMAL HIGH (ref 65–99)

## 2017-04-17 LAB — MAGNESIUM: MAGNESIUM: 1.3 mg/dL — AB (ref 1.7–2.4)

## 2017-04-17 LAB — HIV ANTIBODY (ROUTINE TESTING W REFLEX): HIV Screen 4th Generation wRfx: NONREACTIVE

## 2017-04-17 LAB — PROTIME-INR
INR: 2.55
PROTHROMBIN TIME: 27.2 s — AB (ref 11.4–15.2)

## 2017-04-17 MED ORDER — POTASSIUM CHLORIDE CRYS ER 20 MEQ PO TBCR
40.0000 meq | EXTENDED_RELEASE_TABLET | ORAL | Status: AC
  Administered 2017-04-17 (×2): 40 meq via ORAL
  Filled 2017-04-17 (×2): qty 2

## 2017-04-17 MED ORDER — WARFARIN - PHARMACIST DOSING INPATIENT
Freq: Every day | Status: DC
  Administered 2017-04-19: 18:00:00

## 2017-04-17 MED ORDER — ACYCLOVIR 800 MG PO TABS
800.0000 mg | ORAL_TABLET | Freq: Every day | ORAL | Status: DC
  Administered 2017-04-17 (×2): 800 mg via ORAL
  Filled 2017-04-17 (×8): qty 1

## 2017-04-17 MED ORDER — MAGNESIUM SULFATE 2 GM/50ML IV SOLN
2.0000 g | Freq: Once | INTRAVENOUS | Status: AC
  Administered 2017-04-17: 2 g via INTRAVENOUS
  Filled 2017-04-17: qty 50

## 2017-04-17 MED ORDER — ACYCLOVIR 200 MG PO CAPS
800.0000 mg | ORAL_CAPSULE | Freq: Every day | ORAL | Status: DC
  Filled 2017-04-17: qty 4

## 2017-04-17 MED ORDER — WARFARIN SODIUM 5 MG PO TABS
5.0000 mg | ORAL_TABLET | Freq: Every day | ORAL | Status: DC
  Administered 2017-04-17 – 2017-04-19 (×3): 5 mg via ORAL
  Filled 2017-04-17 (×3): qty 1

## 2017-04-17 MED ORDER — TACROLIMUS 1 MG PO CAPS
2.0000 mg | ORAL_CAPSULE | Freq: Two times a day (BID) | ORAL | Status: DC
  Administered 2017-04-17: 2 mg via ORAL
  Filled 2017-04-17 (×2): qty 2

## 2017-04-17 NOTE — Progress Notes (Signed)
PROGRESS NOTE    Gail King  ZOX:[id number] DOB: 1965-08-13 DOA: 04/16/2017 PCP: System, Pcp Not In     Brief Narrative:  Gail King is a 51 y.o. 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  Patient had been admitted to [facility] from 03/28/2017 until 04/13/2017 for IV diuresis as she had developed anasarca, likely secondary to nephropathy with serum albumin of 1.1.  She underwent IV diuresis and had a mild increase in her creatinine at that time.  She had profuse watery diarrhea with negative C. difficile testing, attributed to cyclosporine and magnesium oxide.  Cyclosporine was changed to Prograf and her diuretics were adjusted in light of the worsening renal function.  She was discharged in much improved and stable condition, but has since developed fevers and chills with purulent drainage from the upper right breast.    Assessment & Plan:   Principal Problem:   Cellulitis of right breast Active Problems:   Hypoalbuminemia   Watery diarrhea   AKI (acute kidney injury) (HCC)   Membranous nephrosis   Acute kidney injury (HCC)   Cellulitis of breast   Sepsis secondary to cellulitis right breast -General surgery following -[place] pending -Blood culture pending  -Continue empiric vanco  -?Shingles, start empiric acyclovir as patient immunosuppressed   Nephrotic syndrome/membranous GN  -Diagnosed by biopsy in 01/2017. Recently admitted at [facility] (10/9-10/25) for anasarca, diuresed with IV lasix, switched to torsemide 20mg  BID, aldactone 12.5mg  daily -Switched from cyclosporine, prednisone to prograf and prednisone  -Coumadin for hypercoagulable state, per pharmacy  -Holding cozaar and diuretics for now due to worsening kidney  function  -Cr on discharge from [facility] was 1.5. Now 1.9 --> 2.26  -Weight at [facility] on 10/25 was 232lb. She is 228lb this admission -Consult Nephrology today due to worsening kidney function   Hypokalemia -Replace, trend  -Check Mg   Diarrhea -Previously thought to be secondary to cyclosporine, magnesium oxide use -C Diff, GI PCR ordered. Per nursing, her stool is more formed today however   DM type 2 -Hold glucotrol -SSI   Major depression, recurrent/panic disorder/insomnia -Continue prozac, seroquel    DVT prophylaxis: Coumadin Code Status: Full Family Communication: No family at bedside Disposition Plan: Pending improvement    Consultants:   General surgery  Nephrology  Procedures:   None   Antimicrobials:  Anti-infectives    Start     Dose/Rate Route Frequency Ordered Stop   04/17/17 1900  vancomycin (VANCOCIN) IVPB 1000 mg/200 mL premix     1,000 mg 200 mL/hr over 60 Minutes Intravenous Every 24 hours 04/16/17 2211     04/16/17 1830  vancomycin (VANCOCIN) IVPB 1000 mg/200 mL premix     1,000 mg 200 mL/hr over 60 Minutes Intravenous  Once 04/16/17 1819 04/16/17 1932        Subjective: Patient complaining of significant pain in the right upper quadrant of right breast. She states that she had marked fluid overload during her previous hospitalization at [facility]. After discharge, she started to notice right breast redness, warmth, as well as draining. She notes that it was draining both pus and serous material. She admits to subjective fevers, but did not take temperature. Denies other chest pain or worsening SOB. Thinks her swelling of legs is mildly better. Urination is  decreased, has only urinated twice in the past day.   Objective: Vitals:   04/16/17 2030 04/16/17 2137 04/16/17 2323 04/17/17 0454  BP: 126/81  (!) 114/51 (!) 93/45  Pulse:   93 99  Resp:   16 18  Temp:   98.9 F (37.2 C) 99.4 F (37.4 C)  TempSrc:   Oral  Oral  SpO2:   98% 94%  Weight:  103.8 kg (228 lb 14.4 oz)    Height:  5\' 4"  (1.626 m)      Intake/Output Summary (Last 24 hours) at 04/17/17 1243 Last data filed at 04/17/17 1000  Gross per 24 hour  Intake                3 ml  Output              100 ml  Net              -97 ml   Filed Weights   04/16/17 1625 04/16/17 2137  Weight: 105.2 kg (232 lb) 103.8 kg (228 lb 14.4 oz)    Examination:  General exam: Appears calm and comfortable  Respiratory system: Clear to auscultation. Respiratory effort normal. Cardiovascular system: S1 & S2 heard, RRR. No JVD, murmurs, rubs, gallops or clicks. +2 pedal edema. Gastrointestinal system: Abdomen is nondistended, soft and nontender. No organomegaly or masses felt. Normal bowel sounds heard. Central nervous system: Alert and oriented. No focal neurological deficits. Extremities: Symmetric 5 x 5 power. Skin: +Right breast with erythema, warmth, tender to palpation esp near axilla, nondraining crusted wounds noted  Psychiatry: Judgement and insight appear normal. Mood & affect appropriate.   Data Reviewed: I have personally reviewed following labs and imaging studies  CBC:  Recent Labs Lab 04/16/17 1628 04/17/17 0545  WBC 12.8* 9.9  NEUTROABS 10.4* 7.7  HGB 13.3 11.3*  HCT 40.1 34.0*  MCV 89.9 89.7  PLT 253 223   Basic Metabolic Panel:  Recent Labs Lab 04/16/17 1628 04/17/17 0545  NA 138 140  K 3.5 3.2*  CL 104 107  CO2 27 24  GLUCOSE 155* 93  BUN 37* 36*  CREATININE 1.90* 2.26*  CALCIUM 7.5* 7.0*   GFR: Estimated Creatinine Clearance: 34.2 mL/min (A) (by C-G formula based on SCr of 2.26 mg/dL (H)). Liver Function Tests:  Recent Labs Lab 04/16/17 1628 04/17/17 0545  AST 20 12*  ALT 18 12*  ALKPHOS 109 83  BILITOT 0.9 0.9  PROT 5.1* 4.1*  ALBUMIN 1.1* <1.0*   No results for input(s): LIPASE, AMYLASE in the last 168 hours. No results for input(s): AMMONIA in the last 168 hours. Coagulation Profile:  Recent  Labs Lab 04/16/17 2245 04/17/17 0545  INR 2.75 2.55   Cardiac Enzymes: No results for input(s): CKTOTAL, CKMB, CKMBINDEX, TROPONINI in the last 168 hours. BNP (last 3 results) No results for input(s): PROBNP in the last 8760 hours. HbA1C: No results for input(s): HGBA1C in the last 72 hours. CBG:  Recent Labs Lab 04/16/17 2320 04/17/17 0811 04/17/17 1220  GLUCAP 108* 101* 171*   Lipid Profile: No results for input(s): CHOL, HDL, LDLCALC, TRIG, CHOLHDL, LDLDIRECT in the last 72 hours. Thyroid Function Tests: No results for input(s): TSH, T4TOTAL, FREET4, T3FREE, THYROIDAB in the last 72 hours. Anemia Panel: No results for input(s): VITAMINB12, FOLATE, FERRITIN, TIBC, IRON, RETICCTPCT in the last 72 hours. Sepsis Labs:  Recent Labs Lab 04/16/17 1651  LATICACIDVEN 0.97    No results found for this or any previous visit (  from the past 240 hour(s)).     Radiology Studies: Dg Chest 2 View  Result Date: 04/16/2017 CLINICAL DATA:  Fever with abscess to the right axilla EXAM: CHEST  2 VIEW COMPARISON:  None. FINDINGS: Small left greater than right pleural effusion. Hazy atelectasis or infiltrate at the left base. Normal heart size. No pneumothorax. Old right fifth rib fracture IMPRESSION: Small left greater than right pleural effusion with left basilar atelectasis or infiltrate Electronically Signed   By: Jasmine PangKim  Fujinaga M.D.   On: 04/16/2017 17:10   Ct Chest Wo Contrast  Result Date: 04/16/2017 CLINICAL DATA:  Chest wall pain. Recent diagnosis of nephrotic syndrome. Patient on immunosuppressants and steroids. Presents today with severe pain in her right breast with redness and drainage. EXAM: CT CHEST WITHOUT CONTRAST TECHNIQUE: Multidetector CT imaging of the chest was performed following the standard protocol without IV contrast. COMPARISON:  Chest x-ray from earlier today FINDINGS: Cardiovascular: Coronary artery calcifications are identified. The heart size is normal. The  thoracic aorta demonstrates mild atherosclerotic change. No aneurysm identified. Central pulmonary arteries are normal in caliber. Mediastinum/Nodes: Small bilateral pleural effusions are seen. A tiny amount of pericardial fluid is noted. The thyroid and esophagus are normal. No adenopathy in the mediastinum or hila. Increased subcutaneous edema identified. There is skin thickening associated with both breasts. No definitive abscess or mass is seen. Lungs/Pleura: Central airways are normal. No pneumothorax. No overt edema. Atelectasis underlies the bilateral effusions. No suspicious infiltrate to suggest pneumonia. No suspicious nodules or masses. Upper Abdomen: No acute abnormality. Musculoskeletal: No chest wall mass or suspicious bone lesions identified. IMPRESSION: 1. The patient's symptoms of breast pain with redness and drainage could be seen with edema, mastitis, or inflammatory breast cancer. No obvious abscess or mass seen on today's imaging. However, mammography and ultrasound would be the studies of choice rather than CT. Recommend referral to the Breast Center for further evaluation. 2. Pleural effusions and subcutaneous edema likely represent volume overload. No pulmonary edema identified. 3. Coronary artery calcifications. Mild atherosclerotic change in the thoracic aorta. 4. No other acute abnormalities. Aortic Atherosclerosis (ICD10-I70.0). Electronically Signed   By: Gerome Samavid  Williams III M.D   On: 04/16/2017 20:53      Scheduled Meds: . dicyclomine  10 mg Oral TID AC & HS  . FLUoxetine  10 mg Oral Daily  . insulin aspart  0-5 Units Subcutaneous QHS  . insulin aspart  0-9 Units Subcutaneous TID WC  . predniSONE  10 mg Oral Q breakfast  . QUEtiapine  25 mg Oral QHS  . sodium chloride flush  3 mL Intravenous Q12H  . tacrolimus  1 mg Oral BID  . warfarin  5 mg Oral q1800  . Warfarin - Pharmacist Dosing Inpatient   Does not apply q1800   Continuous Infusions: . vancomycin       LOS: 1  day    Time spent: 50 minutes   Noralee StainJennifer Long Brimage, DO [facility] [email or web address] Password [email or web address] 04/17/2017, 12:43 PM

## 2017-04-17 NOTE — Progress Notes (Signed)
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  No Known Allergies  Patient Measurements: Height: 5\' 4"  (162.6 cm) Weight: 228 lb 14.4 oz (103.8 kg) IBW/kg (Calculated) : 54.7 Vital Signs: Temp: 98.4 F (36.9 C) (10/29 1459) Temp Source: Oral (10/29 1459) BP: 110/54 (10/29 1459) Pulse Rate: 82 (10/29 1459)  Labs:  Recent Labs  04/16/17 1628 04/16/17 2245 04/17/17 0545  HGB 13.3  --  11.3*  HCT 40.1  --  34.0*  PLT 253  --  223  LABPROT  --  28.9* 27.2*  INR  --  2.75 2.55  CREATININE 1.90*  --  2.26*    Estimated Creatinine Clearance: 34.2 mL/min (A) (by C-G formula based on SCr of 2.26 mg/dL (H)).   Medical History: Past Medical History:  Diagnosis Date  . Diabetes mellitus without complication (HCC)   . Hypertension   . Renal disorder     [name] presents to the ED with right breast cellulitis, on warfarin PTA for hypercoagulable state, INR remains therapeutic on home Coumadin dose, CBC good.   Warfarin PTA dosing: 5 mg daily   Goal of Therapy:  INR 2-3 Monitor platelets by anticoagulation protocol: Yes   Plan:  Warfarin 5 mg daily at 1800 Daily PT/INR Monitor for bleeding   Toys 'R' UsKimberly Kaziah Krizek, Pharm.D., BCPS Clinical Pharmacist Pager: [phone] Clinical phone for 04/17/2017 from 8:30-4:00 is [phone]. After 4pm, please call Main Rx [phone]) for assistance. 04/17/2017 4:30 PM

## 2017-04-17 NOTE — Consult Note (Signed)
[facility] Consult Note     Date: 04/17/2017                  Patient Name:  Gail King  MRN: [id number]  DOB: Nov 10, 1965  Age / Sex: 51 y.o., female         PCP: System, Pcp Not In                 Service Requesting Consult: [facility], Dr. Maylene Roes                 Reason for Consult: Membranous nephropathy             Chief Complaint: R breast abscess and fever   HPI: Pt is a 24F with a PMH significant for biopsy-proven membranous nephropathy (PLA2R positive) diagnosed in 01/2017, HTN, HLD, and a recent admission to [facility] for anasarca who is now seen in consultation at the request of Dr. Maylene Roes for evaluation and recommendations surroudning acute kidney injury superimposed on CKD.    Briefly, pt had a recent admission to [facility] from 10/9-- 10/25.  She had initially been placed on high dose steroids and cyclosporine for membranous GN.  She had intolerance to cyclosporine as it caused diarrhea.  She has been tapered on pred to 10 mg daily and is on tac 2 mg BID.    She presented yesterday with reports of pustules, fever, and redness/ swelling over her R breast.  She is getting a breast ultrasound.  Shingles is being considered and she's on acyclovir 800 mg 5x daily as well as empiric antibiotics.  Her creatinine has uptrended from 1.1--> 1.5--> 2.2.  Her albumin is < 1.0.  She is on empiric anticoagulation with warfarin due to her nephrotic syndrome.    She reports pain in her R breast.  She is otherwise without complaint.    Past Medical History:  Diagnosis Date  . Diabetes mellitus without complication ([place])   . Hypertension   . Renal disorder     History reviewed. No pertinent surgical history.  History reviewed. No pertinent family history. Social History:  reports that she quit smoking about 8 years ago. She has never used smokeless tobacco. She reports that she drinks alcohol. She reports that she does not use  drugs.  Allergies: No Known Allergies  Medications Prior to Admission  Medication Sig Dispense Refill  . dicyclomine (BENTYL) 10 MG capsule Take 10 mg by mouth 4 (four) times daily -  before meals and at bedtime.    Marland Kitchen FLUoxetine (PROZAC) 10 MG capsule Take 10 mg by mouth daily.    Marland Kitchen glipiZIDE (GLUCOTROL) 5 MG tablet Take 5 mg by mouth daily before breakfast.    . losartan (COZAAR) 50 MG tablet Take 50 mg by mouth daily.    . ondansetron (ZOFRAN) 4 MG tablet Take 4 mg by mouth every 8 (eight) hours as needed for nausea or vomiting.    . predniSONE (DELTASONE) 10 MG tablet Take 10 mg by mouth daily with breakfast.    . QUEtiapine (SEROQUEL) 50 MG tablet Take 50 mg by mouth at bedtime.     Marland Kitchen spironolactone (ALDACTONE) 25 MG tablet Take 12.5 mg by mouth daily.     . tacrolimus (PROGRAF) 1 MG capsule Take 2 mg by mouth every 12 (twelve) hours.     . torsemide (DEMADEX) 20 MG tablet Take 20 mg by mouth 2 (two) times daily.    Marland Kitchen warfarin (COUMADIN)  5 MG tablet Take 5 mg by mouth daily at 6 PM.       Results for orders placed or performed during the hospital encounter of 04/16/17 (from the past 48 hour(s))  Comprehensive metabolic panel     Status: Abnormal   Collection Time: 04/16/17  4:28 PM  Result Value Ref Range   Sodium 138 135 - 145 mmol/L   Potassium 3.5 3.5 - 5.1 mmol/L   Chloride 104 101 - 111 mmol/L   CO2 27 22 - 32 mmol/L   Glucose, Bld 155 (H) 65 - 99 mg/dL   BUN 37 (H) 6 - 20 mg/dL   Creatinine, Ser 1.90 (H) 0.44 - 1.00 mg/dL   Calcium 7.5 (L) 8.9 - 10.3 mg/dL   Total Protein 5.1 (L) 6.5 - 8.1 g/dL   Albumin 1.1 (L) 3.5 - 5.0 g/dL   AST 20 15 - 41 U/L   ALT 18 14 - 54 U/L   Alkaline Phosphatase 109 38 - 126 U/L   Total Bilirubin 0.9 0.3 - 1.2 mg/dL   GFR calc non Af Amer 29 (L) >60 mL/min   GFR calc Af Amer 34 (L) >60 mL/min    Comment: (NOTE) The eGFR has been calculated using the CKD EPI equation. This calculation has not been validated in all clinical  situations. eGFR's persistently <60 mL/min signify possible Chronic Kidney Disease.    Anion gap 7 5 - 15  CBC with Differential     Status: Abnormal   Collection Time: 04/16/17  4:28 PM  Result Value Ref Range   WBC 12.8 (H) 4.0 - 10.5 K/uL   RBC 4.46 3.87 - 5.11 MIL/uL   Hemoglobin 13.3 12.0 - 15.0 g/dL   HCT 40.1 36.0 - 46.0 %   MCV 89.9 78.0 - 100.0 fL   MCH 29.8 26.0 - 34.0 pg   MCHC 33.2 30.0 - 36.0 g/dL   RDW 16.3 (H) 11.5 - 15.5 %   Platelets 253 150 - 400 K/uL   Neutrophils Relative % 74 %   Lymphocytes Relative 10 %   Monocytes Relative 5 %   Eosinophils Relative 3 %   Basophils Relative 1 %   Band Neutrophils 5 %   Metamyelocytes Relative 1 %   Myelocytes 1 %   Neutro Abs 10.4 (H) 1.7 - 7.7 K/uL   Lymphs Abs 1.3 0.7 - 4.0 K/uL   Monocytes Absolute 0.6 0.1 - 1.0 K/uL   Eosinophils Absolute 0.4 0.0 - 0.7 K/uL   Basophils Absolute 0.1 0.0 - 0.1 K/uL   WBC Morphology VACUOLATED NEUTROPHILS   I-Stat CG4 Lactic Acid, ED     Status: None   Collection Time: 04/16/17  4:51 PM  Result Value Ref Range   Lactic Acid, Venous 0.97 0.5 - 1.9 mmol/L  Protime-INR     Status: Abnormal   Collection Time: 04/16/17 10:45 PM  Result Value Ref Range   Prothrombin Time 28.9 (H) 11.4 - 15.2 seconds   INR 2.75   Glucose, capillary     Status: Abnormal   Collection Time: 04/16/17 11:20 PM  Result Value Ref Range   Glucose-Capillary 108 (H) 65 - 99 mg/dL  Urinalysis, Routine w reflex microscopic     Status: Abnormal   Collection Time: 04/16/17 11:30 PM  Result Value Ref Range   Color, Urine YELLOW YELLOW   APPearance HAZY (A) CLEAR   Specific Gravity, Urine 1.023 1.005 - 1.030   pH 9.0 (H) 5.0 - 8.0   Glucose,  UA 150 (A) NEGATIVE mg/dL   Hgb urine dipstick NEGATIVE NEGATIVE   Bilirubin Urine NEGATIVE NEGATIVE   Ketones, ur NEGATIVE NEGATIVE mg/dL   Protein, ur >=300 (A) NEGATIVE mg/dL   Nitrite NEGATIVE NEGATIVE   Leukocytes, UA NEGATIVE NEGATIVE   RBC / HPF 6-30 0 - 5  RBC/hpf   WBC, UA 6-30 0 - 5 WBC/hpf   Bacteria, UA NONE SEEN NONE SEEN   Squamous Epithelial / LPF 0-5 (A) NONE SEEN   Mucus PRESENT    Hyaline Casts, UA PRESENT    Non Squamous Epithelial 0-5 (A) NONE SEEN  HIV antibody (Routine Testing)     Status: None   Collection Time: 04/17/17  5:45 AM  Result Value Ref Range   HIV Screen 4th Generation wRfx Non Reactive Non Reactive    Comment: (NOTE) Performed At: [facility] [place], [place] [id number] Lindon Romp MD [id number]   CBC WITH DIFFERENTIAL     Status: Abnormal   Collection Time: 04/17/17  5:45 AM  Result Value Ref Range   WBC 9.9 4.0 - 10.5 K/uL   RBC 3.79 (L) 3.87 - 5.11 MIL/uL   Hemoglobin 11.3 (L) 12.0 - 15.0 g/dL   HCT 34.0 (L) 36.0 - 46.0 %   MCV 89.7 78.0 - 100.0 fL   MCH 29.8 26.0 - 34.0 pg   MCHC 33.2 30.0 - 36.0 g/dL   RDW 16.6 (H) 11.5 - 15.5 %   Platelets 223 150 - 400 K/uL   Neutrophils Relative % 78 %   Lymphocytes Relative 12 %   Monocytes Relative 9 %   Eosinophils Relative 1 %   Basophils Relative 0 %   Neutro Abs 7.7 1.7 - 7.7 K/uL   Lymphs Abs 1.2 0.7 - 4.0 K/uL   Monocytes Absolute 0.9 0.1 - 1.0 K/uL   Eosinophils Absolute 0.1 0.0 - 0.7 K/uL   Basophils Absolute 0.0 0.0 - 0.1 K/uL   WBC Morphology MILD LEFT SHIFT (1-5% METAS, OCC MYELO, OCC BANDS)   Comprehensive metabolic panel     Status: Abnormal   Collection Time: 04/17/17  5:45 AM  Result Value Ref Range   Sodium 140 135 - 145 mmol/L   Potassium 3.2 (L) 3.5 - 5.1 mmol/L   Chloride 107 101 - 111 mmol/L   CO2 24 22 - 32 mmol/L   Glucose, Bld 93 65 - 99 mg/dL   BUN 36 (H) 6 - 20 mg/dL   Creatinine, Ser 2.26 (H) 0.44 - 1.00 mg/dL   Calcium 7.0 (L) 8.9 - 10.3 mg/dL   Total Protein 4.1 (L) 6.5 - 8.1 g/dL   Albumin <1.0 (L) 3.5 - 5.0 g/dL    Comment: REPEATED TO VERIFY   AST 12 (L) 15 - 41 U/L   ALT 12 (L) 14 - 54 U/L   Alkaline Phosphatase 83 38 - 126 U/L   Total Bilirubin 0.9 0.3 - 1.2 mg/dL   GFR calc  non Af Amer 24 (L) >60 mL/min   GFR calc Af Amer 28 (L) >60 mL/min    Comment: (NOTE) The eGFR has been calculated using the CKD EPI equation. This calculation has not been validated in all clinical situations. eGFR's persistently <60 mL/min signify possible Chronic Kidney Disease.    Anion gap 9 5 - 15  Protime-INR     Status: Abnormal   Collection Time: 04/17/17  5:45 AM  Result Value Ref Range   Prothrombin Time 27.2 (H) 11.4 - 15.2 seconds  INR 2.55   Glucose, capillary     Status: Abnormal   Collection Time: 04/17/17  8:11 AM  Result Value Ref Range   Glucose-Capillary 101 (H) 65 - 99 mg/dL  Glucose, capillary     Status: Abnormal   Collection Time: 04/17/17 12:20 PM  Result Value Ref Range   Glucose-Capillary 171 (H) 65 - 99 mg/dL  Magnesium     Status: Abnormal   Collection Time: 04/17/17 12:56 PM  Result Value Ref Range   Magnesium 1.3 (L) 1.7 - 2.4 mg/dL  Glucose, capillary     Status: Abnormal   Collection Time: 04/17/17  5:36 PM  Result Value Ref Range   Glucose-Capillary 138 (H) 65 - 99 mg/dL   Dg Chest 2 View  Result Date: 04/16/2017 CLINICAL DATA:  Fever with abscess to the right axilla EXAM: CHEST  2 VIEW COMPARISON:  None. FINDINGS: Small left greater than right pleural effusion. Hazy atelectasis or infiltrate at the left base. Normal heart size. No pneumothorax. Old right fifth rib fracture IMPRESSION: Small left greater than right pleural effusion with left basilar atelectasis or infiltrate Electronically Signed   By: Donavan Foil M.D.   On: 04/16/2017 17:10   Ct Chest Wo Contrast  Result Date: 04/16/2017 CLINICAL DATA:  Chest wall pain. Recent diagnosis of nephrotic syndrome. Patient on immunosuppressants and steroids. Presents today with severe pain in her right breast with redness and drainage. EXAM: CT CHEST WITHOUT CONTRAST TECHNIQUE: Multidetector CT imaging of the chest was performed following the standard protocol without IV contrast. COMPARISON:   Chest x-ray from earlier today FINDINGS: Cardiovascular: Coronary artery calcifications are identified. The heart size is normal. The thoracic aorta demonstrates mild atherosclerotic change. No aneurysm identified. Central pulmonary arteries are normal in caliber. Mediastinum/Nodes: Small bilateral pleural effusions are seen. A tiny amount of pericardial fluid is noted. The thyroid and esophagus are normal. No adenopathy in the mediastinum or hila. Increased subcutaneous edema identified. There is skin thickening associated with both breasts. No definitive abscess or mass is seen. Lungs/Pleura: Central airways are normal. No pneumothorax. No overt edema. Atelectasis underlies the bilateral effusions. No suspicious infiltrate to suggest pneumonia. No suspicious nodules or masses. Upper Abdomen: No acute abnormality. Musculoskeletal: No chest wall mass or suspicious bone lesions identified. IMPRESSION: 1. The patient's symptoms of breast pain with redness and drainage could be seen with edema, mastitis, or inflammatory breast cancer. No obvious abscess or mass seen on today's imaging. However, mammography and ultrasound would be the studies of choice rather than CT. Recommend referral to the [facility] for further evaluation. 2. Pleural effusions and subcutaneous edema likely represent volume overload. No pulmonary edema identified. 3. Coronary artery calcifications. Mild atherosclerotic change in the thoracic aorta. 4. No other acute abnormalities. Aortic Atherosclerosis (ICD10-I70.0). Electronically Signed   By: Dorise Bullion III M.D   On: 04/16/2017 20:53    ROS: all other systems reviewed and are negative except as per HPI  Blood pressure (!) 110/54, pulse 82, temperature 98.4 F (36.9 C), temperature source Oral, resp. rate 16, height _0  (1.626 m), weight 103.8 kg (228 lb 14.4 oz), SpO2 97 %. Physical Exam  GEN pale, NAD HEENT EOMI, PERRL, MMM NECK no JVD laying flat BREAST: R breast with upper  outer quadrant + redness, swelling, induration, warmth and a ~2 cm ulceration.  + pustules on the inner upper arm. PULM clear bilaterally  CV RRR no m/r/g ABD some mild abd wall edema EXT 2+ upper and LE  edema NEURO AAO x 3  Assessment/Plan  1.  Acute kidney injury superimposed on CKD: AKI likely hemodynamically mediated in setting of sepsis/ diuresis/ third spacing.  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  She has fairly severe nephrotic syndrome with her albumin in the 1s.  As such, empiric anticoagulation is appropriate.  The natural history of MN is variable- some individuals spontaneously remit, others require medication, and still others have an unrelenting course which ends in ESRD.  She was diagnosed < 6 months ago.  For now, appropriate to continue CNI and steroids.  If no improvement, would consider cyclophosphamide as an outpatient.  Would not re-pulse with steroids given infection.  Agree with holding ARB.  Will get tac trough.    2.  Anasarca: I expect her to third space.  She may need an albumin/ Lasix combination; holding diuresis (was discharged on torsemide and aldactone) now as she is to be NPO.  3.  Breast cellulitis/ shingles: on vanc, acyclovir.  Please consider less nephrotoxic medications if at all possible.     Madelon Lips MD [facility] pgr [phone] 04/17/2017, 6:11 PM

## 2017-04-17 NOTE — Progress Notes (Signed)
ANTIBIOTIC CONSULT NOTE - INITIAL  Pharmacy Consult for Acyclovir Indication: r/o shingles  No Known Allergies  Patient Measurements: Height: 5\' 4"  (162.6 cm) Weight: 228 lb 14.4 oz (103.8 kg) IBW/kg (Calculated) : 54.7 Adjusted Body Weight:    Vital Signs: Temp: 98.4 F (36.9 C) (10/29 1459) Temp Source: Oral (10/29 1459) BP: 110/54 (10/29 1459) Pulse Rate: 82 (10/29 1459) Intake/Output from previous day: No intake/output data recorded. Intake/Output from this shift: Total I/O In: 3 [I.V.:3] Out: 100 [Urine:100]  Labs:  Recent Labs  04/16/17 1628 04/17/17 0545  WBC 12.8* 9.9  HGB 13.3 11.3*  PLT 253 223  CREATININE 1.90* 2.26*   Estimated Creatinine Clearance: 34.2 mL/min (A) (by C-G formula based on SCr of 2.26 mg/dL (H)). No results for input(s): VANCOTROUGH, VANCOPEAK, VANCORANDOM, GENTTROUGH, GENTPEAK, GENTRANDOM, TOBRATROUGH, TOBRAPEAK, TOBRARND, AMIKACINPEAK, AMIKACINTROU, AMIKACIN in the last 72 hours.   Microbiology: No results found for this or any previous visit (from the past 720 hour(s)).  Medical History: Past Medical History:  Diagnosis Date  . Diabetes mellitus without complication ([facility])   . Hypertension   . Renal disorder     Assessment: CC/HPI: fevers, pain, erythema, and purulent drainage from her upper right breast.  PMH: membranous nephropathy confirmed by biopsy, type 2 diabetes mellitus, hypertension, major depression, and insomnia, hypoalbumin,   Significant events: [facility] with anasarca and diarrhea (negative Cdiff), AKI,  ID: Chronic immunosuppression. R breast abscess/cellulitis. Cannot r/o shingles WBC 12.8. Scr 2.26 up today 10/28 vancomycin >> 10/29 Acyclovir>>  Goal of Therapy:  Eradication of infection   Plan:  Acyclovir 800mg  po five times daily (no renal adjustment for CrCl>25)  Gryphon Vanderveen S. Merilynn Finlandobertson, PharmD, BCPS Clinical Staff Pharmacist Pager [phone]  Misty Stanleyobertson, Layna Roeper  [place] 04/17/2017,3:05 PM

## 2017-04-17 NOTE — Progress Notes (Signed)
NURSING PROGRESS NOTE  Gail King [id number] Admission Data: 04/17/2017 12:48 AM Attending Provider: Briscoe Deutscherpyd, Timothy S, MD WUJ:[id number]:System, Pcp Not In Code Status: FUll  Allergies:  Patient has no known allergies. Past Medical History:   has a past medical history of Diabetes mellitus without complication ([facility]); Hypertension; and Renal disorder. Past Surgical History:   has no past surgical history on file. Social History:   reports that she quit smoking about 8 years ago. She has never used smokeless tobacco. She reports that she drinks alcohol. She reports that she does not use drugs.  Gail King is a 51 y.o. female patient admitted from ED:   Last Documented Vital Signs: Blood pressure (!) 114/51, pulse 93, temperature 98.9 F (37.2 C), temperature source Oral, resp. rate 16, height 5\' 4"  (1.626 m), weight 103.8 kg (228 lb 14.4 oz), SpO2 98 %.  Cardiac Monitoring: Box # 36 in place. Cardiac monitor yields:normal sinus rhythm.  IV Fluids:  IV in place, occlusive dsg intact without redness, IV cath antecubital right, condition patent and no redness and left, condition no redness none.   Skin: Appropriate for ethnicity with cellulitis and rash on right breast.  Rash on arms and legs.  Patient/Family orientated to room. Information packet given to patient/family. Admission inpatient armband information verified with patient/family to include name and date of birth and placed on patient arm. Side rails up x 2, fall assessment and education completed with patient/family. Patient/family able to verbalize understanding of risk associated with falls and verbalized understanding to call for assistance before getting out of bed. Call light within reach. Patient/family able to voice and demonstrate understanding of unit orientation instructions.    Will continue to evaluate and treat per MD orders.  [name] RN, BSN

## 2017-04-17 NOTE — Progress Notes (Signed)
[facility] Progress Note     Subjective: CC:  Patient endorses right breast/axillary discomfort she states started as an area in the upper outer quadrant of her breast that was "weepy" after her recent hospitalization and has spread towards her axilla and back. She endorses severe, aching axillary pain and increased "firm" tissue in this area. Endorses burning pain of axilla/proximal and medial right arm. Tolerating PO.  TMAX 100.6 / 24 h Objective: Vital signs in last 24 hours: Temp:  [98.9 F (37.2 C)-100.6 F (38.1 C)] 99.4 F (37.4 C) (10/29 0454) Pulse Rate:  [93-112] 99 (10/29 0454) Resp:  [16-24] 18 (10/29 0454) BP: (93-152)/(45-108) 93/45 (10/29 0454) SpO2:  [94 %-99 %] 94 % (10/29 0454) Weight:  [103.8 kg (228 lb 14.4 oz)-105.2 kg (232 lb)] 103.8 kg (228 lb 14.4 oz) (10/28 2137) Last BM Date: 04/16/17  Intake/Output from previous day: No intake/output data recorded. Intake/Output this shift: No intake/output data recorded.  PE: Gen:  Alert, NAD,cooperative HEENT: anicteric sclerae, pupils equal and round   Breast: upper outer quadrant of right breast with a 2-3 cm crusted lesion that patient states was previously weeping. About 4 cm proximal to this is a roughly 3x6 cm area of erythema and induration without palpable fluctuance that is significantly TTP. Multiple axillary vesicles/pustules with underlying erythema.   Card:  Regular rate and rhythm, pedal pulses 2+ BL Pulm:  Normal effort, clear to auscultation bilaterally Abd: Soft, non-tender, non-distended, bowel sounds present Skin: warm and dry, no rashes  Psych: A&Ox3   Lab Results:   Recent Labs  04/16/17 1628  WBC 12.8*  HGB 13.3  HCT 40.1  PLT 253   BMET  Recent Labs  04/16/17 1628  NA 138  K 3.5  CL 104  CO2 27  GLUCOSE 155*  BUN 37*  CREATININE 1.90*  CALCIUM 7.5*   PT/INR  Recent Labs  04/16/17 2245  LABPROT 28.9*  INR 2.75   CMP     Component Value Date/Time    NA 138 04/16/2017 1628   K 3.5 04/16/2017 1628   CL 104 04/16/2017 1628   CO2 27 04/16/2017 1628   GLUCOSE 155 (H) 04/16/2017 1628   BUN 37 (H) 04/16/2017 1628   CREATININE 1.90 (H) 04/16/2017 1628   CALCIUM 7.5 (L) 04/16/2017 1628   PROT 5.1 (L) 04/16/2017 1628   ALBUMIN 1.1 (L) 04/16/2017 1628   AST 20 04/16/2017 1628   ALT 18 04/16/2017 1628   ALKPHOS 109 04/16/2017 1628   BILITOT 0.9 04/16/2017 1628   GFRNONAA 29 (L) 04/16/2017 1628   GFRAA 34 (L) 04/16/2017 1628   Lipase  No results found for: LIPASE     Studies/Results: Dg Chest 2 View  Result Date: 04/16/2017 CLINICAL DATA:  Fever with abscess to the right axilla EXAM: CHEST  2 VIEW COMPARISON:  None. FINDINGS: Small left greater than right pleural effusion. Hazy atelectasis or infiltrate at the left base. Normal heart size. No pneumothorax. Old right fifth rib fracture IMPRESSION: Small left greater than right pleural effusion with left basilar atelectasis or infiltrate Electronically Signed   By: Jasmine Pang M.D.   On: 04/16/2017 17:10   Ct Chest Wo Contrast  Result Date: 04/16/2017 CLINICAL DATA:  Chest wall pain. Recent diagnosis of nephrotic syndrome. Patient on immunosuppressants and steroids. Presents today with severe pain in her right breast with redness and drainage. EXAM: CT CHEST WITHOUT CONTRAST TECHNIQUE: Multidetector CT imaging of the chest was performed following the standard protocol  without IV contrast. COMPARISON:  Chest x-ray from earlier today FINDINGS: Cardiovascular: Coronary artery calcifications are identified. The heart size is normal. The thoracic aorta demonstrates mild atherosclerotic change. No aneurysm identified. Central pulmonary arteries are normal in caliber. Mediastinum/Nodes: Small bilateral pleural effusions are seen. A tiny amount of pericardial fluid is noted. The thyroid and esophagus are normal. No adenopathy in the mediastinum or hila. Increased subcutaneous edema identified.  There is skin thickening associated with both breasts. No definitive abscess or mass is seen. Lungs/Pleura: Central airways are normal. No pneumothorax. No overt edema. Atelectasis underlies the bilateral effusions. No suspicious infiltrate to suggest pneumonia. No suspicious nodules or masses. Upper Abdomen: No acute abnormality. Musculoskeletal: No chest wall mass or suspicious bone lesions identified. IMPRESSION: 1. The patient's symptoms of breast pain with redness and drainage could be seen with edema, mastitis, or inflammatory breast cancer. No obvious abscess or mass seen on today's imaging. However, mammography and ultrasound would be the studies of choice rather than CT. [facility]. 2. Pleural effusions and subcutaneous edema likely represent volume overload. No pulmonary edema identified. 3. Coronary artery calcifications. Mild atherosclerotic change in the thoracic aorta. 4. No other acute abnormalities. Aortic Atherosclerosis (ICD10-I70.0). Electronically Signed   By: Gerome Samavid  Williams III M.D   On: 04/16/2017 20:53    Anti-infectives: Anti-infectives    Start     Dose/Rate Route Frequency Ordered Stop   04/17/17 1900  vancomycin (VANCOCIN) IVPB 1000 mg/200 mL premix     1,000 mg 200 mL/hr over 60 Minutes Intravenous Every 24 hours 04/16/17 2211     04/16/17 1830  vancomycin (VANCOCIN) IVPB 1000 mg/200 mL premix     1,000 mg 200 mL/hr over 60 Minutes Intravenous  Once 04/16/17 1819 04/16/17 1932     Assessment/Plan HTN DM Membranous nephropathy Major depressive disorder Insomnia   Cellulitis right breast   - U/S to r/o drainable fluid collection pending   - empiric IV abx  - tylenol PRN for fever   - cannot exclude shingles.   - general surgery will follow     LOS: 1 day    Adam PhenixElizabeth S Urie Loughner , [facility] 04/17/2017, 7:43 AM Pager: [phone] Consults: [phone] Mon-Fri 7:00 am-4:30 pm Sat-Sun  7:00 am-11:30 am

## 2017-04-18 ENCOUNTER — Inpatient Hospital Stay (HOSPITAL_COMMUNITY): Payer: Medicaid Other | Admitting: Certified Registered Nurse Anesthetist

## 2017-04-18 ENCOUNTER — Encounter (HOSPITAL_COMMUNITY): Payer: Self-pay | Admitting: Certified Registered Nurse Anesthetist

## 2017-04-18 ENCOUNTER — Encounter (HOSPITAL_COMMUNITY)
Admission: EM | Disposition: A | Discharge status: Home Health | Payer: Self-pay | Source: Home / Self Care | Attending: Internal Medicine

## 2017-04-18 HISTORY — PX: IRRIGATION AND DEBRIDEMENT ABSCESS: SHX5252

## 2017-04-18 LAB — BASIC METABOLIC PANEL
Anion gap: 10 (ref 5–15)
BUN: 42 mg/dL — AB (ref 6–20)
CALCIUM: 7.2 mg/dL — AB (ref 8.9–10.3)
CHLORIDE: 103 mmol/L (ref 101–111)
CO2: 25 mmol/L (ref 22–32)
CREATININE: 2.68 mg/dL — AB (ref 0.44–1.00)
GFR calc non Af Amer: 19 mL/min — ABNORMAL LOW (ref >60)
GFR, EST AFRICAN AMERICAN: 22 mL/min — AB (ref >60)
Glucose, Bld: 96 mg/dL (ref 65–99)
Potassium: 3.9 mmol/L (ref 3.5–5.1)
SODIUM: 138 mmol/L (ref 135–145)

## 2017-04-18 LAB — GASTROINTESTINAL PANEL BY PCR, STOOL (REPLACES STOOL CULTURE)
ADENOVIRUS F40/41: NOT DETECTED
ASTROVIRUS: NOT DETECTED
Campylobacter species: NOT DETECTED
Cryptosporidium: NOT DETECTED
Cyclospora cayetanensis: NOT DETECTED
ENTAMOEBA HISTOLYTICA: NOT DETECTED
ENTEROAGGREGATIVE E COLI (EAEC): NOT DETECTED
ENTEROPATHOGENIC E COLI (EPEC): NOT DETECTED
Enterotoxigenic E coli (ETEC): NOT DETECTED
GIARDIA LAMBLIA: NOT DETECTED
NOROVIRUS GI/GII: NOT DETECTED
Plesimonas shigelloides: NOT DETECTED
ROTAVIRUS A: NOT DETECTED
SHIGELLA/ENTEROINVASIVE E COLI (EIEC): NOT DETECTED
Salmonella species: NOT DETECTED
Sapovirus (I, II, IV, and V): NOT DETECTED
Shiga like toxin producing E coli (STEC): NOT DETECTED
VIBRIO CHOLERAE: NOT DETECTED
Vibrio species: NOT DETECTED
Yersinia enterocolitica: NOT DETECTED

## 2017-04-18 LAB — GLUCOSE, CAPILLARY
GLUCOSE-CAPILLARY: 94 mg/dL (ref 65–99)
GLUCOSE-CAPILLARY: 207 mg/dL — AB (ref 65–99)
Glucose-Capillary: 96 mg/dL (ref 65–99)
Glucose-Capillary: 84 mg/dL (ref 65–99)
Glucose-Capillary: 188 mg/dL — ABNORMAL HIGH (ref 65–99)

## 2017-04-18 LAB — CBC
HCT: 35.9 % — ABNORMAL LOW (ref 36.0–46.0)
Hemoglobin: 12 g/dL (ref 12.0–15.0)
MCH: 30.2 pg (ref 26.0–34.0)
MCHC: 33.4 g/dL (ref 30.0–36.0)
MCV: 90.2 fL (ref 78.0–100.0)
Platelets: 289 10*3/uL (ref 150–400)
RBC: 3.98 MIL/uL (ref 3.87–5.11)
RDW: 16.5 % — AB (ref 11.5–15.5)
WBC: 13.3 10*3/uL — ABNORMAL HIGH (ref 4.0–10.5)

## 2017-04-18 LAB — PROTIME-INR
INR: 2.08
PROTHROMBIN TIME: 23.2 s — AB (ref 11.4–15.2)

## 2017-04-18 LAB — MAGNESIUM: MAGNESIUM: 1.7 mg/dL (ref 1.7–2.4)

## 2017-04-18 SURGERY — IRRIGATION AND DEBRIDEMENT ABSCESS
Anesthesia: General | Laterality: Right

## 2017-04-18 MED ORDER — ONDANSETRON HCL 4 MG/2ML IJ SOLN
INTRAMUSCULAR | Status: AC
  Filled 2017-04-18: qty 2

## 2017-04-18 MED ORDER — HYDROMORPHONE HCL 1 MG/ML IJ SOLN
0.2500 mg | INTRAMUSCULAR | Status: DC | PRN
  Administered 2017-04-18: 0.25 mg via INTRAVENOUS

## 2017-04-18 MED ORDER — ONDANSETRON HCL 4 MG/2ML IJ SOLN
4.0000 mg | Freq: Once | INTRAMUSCULAR | Status: AC | PRN
  Administered 2017-04-18: 4 mg via INTRAVENOUS

## 2017-04-18 MED ORDER — BUPIVACAINE-EPINEPHRINE (PF) 0.5% -1:200000 IJ SOLN
INTRAMUSCULAR | Status: AC
  Filled 2017-04-18: qty 30

## 2017-04-18 MED ORDER — LACTATED RINGERS IV SOLN
INTRAVENOUS | Status: DC | PRN
  Administered 2017-04-18: 11:00:00 via INTRAVENOUS

## 2017-04-18 MED ORDER — VALACYCLOVIR HCL 500 MG PO TABS
1000.0000 mg | ORAL_TABLET | ORAL | Status: DC
  Administered 2017-04-18 – 2017-04-21 (×4): 1000 mg via ORAL
  Filled 2017-04-18 (×4): qty 2

## 2017-04-18 MED ORDER — TACROLIMUS 1 MG PO CAPS
1.0000 mg | ORAL_CAPSULE | Freq: Two times a day (BID) | ORAL | Status: DC
  Administered 2017-04-18 – 2017-04-22 (×8): 1 mg via ORAL
  Filled 2017-04-18 (×8): qty 1

## 2017-04-18 MED ORDER — PROPOFOL 10 MG/ML IV BOLUS
INTRAVENOUS | Status: DC | PRN
  Administered 2017-04-18: 150 mg via INTRAVENOUS

## 2017-04-18 MED ORDER — BUPIVACAINE HCL (PF) 0.5 % IJ SOLN
INTRAMUSCULAR | Status: AC
  Filled 2017-04-18: qty 30

## 2017-04-18 MED ORDER — MEPERIDINE HCL 25 MG/ML IJ SOLN: 6.2500 mg | INTRAMUSCULAR | Status: DC | PRN

## 2017-04-18 MED ORDER — SODIUM CHLORIDE 0.9 % IV SOLN
INTRAVENOUS | Status: DC
  Administered 2017-04-18 (×2): via INTRAVENOUS

## 2017-04-18 MED ORDER — EPHEDRINE 5 MG/ML INJ
INTRAVENOUS | Status: AC
  Filled 2017-04-18: qty 10

## 2017-04-18 MED ORDER — MIDAZOLAM HCL 2 MG/2ML IJ SOLN
INTRAMUSCULAR | Status: AC
  Filled 2017-04-18: qty 2

## 2017-04-18 MED ORDER — DEXAMETHASONE SODIUM PHOSPHATE 10 MG/ML IJ SOLN
INTRAMUSCULAR | Status: DC | PRN
  Administered 2017-04-18: 4 mg via INTRAVENOUS

## 2017-04-18 MED ORDER — HYDROMORPHONE HCL 1 MG/ML IJ SOLN
0.5000 mg | INTRAMUSCULAR | Status: DC | PRN
  Administered 2017-04-19: 1 mg via INTRAVENOUS
  Administered 2017-04-20: 0.5 mg via INTRAVENOUS
  Administered 2017-04-20 – 2017-04-21 (×2): 1 mg via INTRAVENOUS
  Filled 2017-04-18 (×4): qty 1

## 2017-04-18 MED ORDER — PHENYLEPHRINE 40 MCG/ML (10ML) SYRINGE FOR IV PUSH (FOR BLOOD PRESSURE SUPPORT)
PREFILLED_SYRINGE | INTRAVENOUS | Status: AC
  Filled 2017-04-18: qty 10

## 2017-04-18 MED ORDER — FENTANYL CITRATE (PF) 250 MCG/5ML IJ SOLN
INTRAMUSCULAR | Status: DC | PRN
  Administered 2017-04-18 (×2): 50 ug via INTRAVENOUS

## 2017-04-18 MED ORDER — BUPIVACAINE-EPINEPHRINE 0.5% -1:200000 IJ SOLN
INTRAMUSCULAR | Status: DC | PRN
  Administered 2017-04-18 (×2): 20 mL

## 2017-04-18 MED ORDER — PROPOFOL 10 MG/ML IV BOLUS
INTRAVENOUS | Status: AC
  Filled 2017-04-18: qty 20

## 2017-04-18 MED ORDER — LIDOCAINE HCL (CARDIAC) 20 MG/ML IV SOLN
INTRAVENOUS | Status: DC | PRN
Start: 2017-04-18 — End: 2017-04-18
  Administered 2017-04-18: 100 mg via INTRATRACHEAL

## 2017-04-18 MED ORDER — MIDAZOLAM HCL 2 MG/2ML IJ SOLN
INTRAMUSCULAR | Status: DC | PRN
  Administered 2017-04-18 (×2): 1 mg via INTRAVENOUS

## 2017-04-18 MED ORDER — ONDANSETRON HCL 4 MG/2ML IJ SOLN
INTRAMUSCULAR | Status: DC | PRN
  Administered 2017-04-18: 4 mg via INTRAVENOUS

## 2017-04-18 MED ORDER — HYDROMORPHONE HCL 1 MG/ML IJ SOLN
INTRAMUSCULAR | Status: AC
Start: 2017-04-18 — End: 2017-04-19
  Filled 2017-04-18: qty 1

## 2017-04-18 MED ORDER — DEXAMETHASONE SODIUM PHOSPHATE 10 MG/ML IJ SOLN
INTRAMUSCULAR | Status: AC
  Filled 2017-04-18: qty 1

## 2017-04-18 MED ORDER — FENTANYL CITRATE (PF) 250 MCG/5ML IJ SOLN
INTRAMUSCULAR | Status: AC
  Filled 2017-04-18: qty 5

## 2017-04-18 MED ORDER — 0.9 % SODIUM CHLORIDE (POUR BTL) OPTIME
TOPICAL | Status: DC | PRN
  Administered 2017-04-18: 1000 mL

## 2017-04-18 MED ORDER — LIDOCAINE 2% (20 MG/ML) 5 ML SYRINGE
INTRAMUSCULAR | Status: AC
  Filled 2017-04-18: qty 5

## 2017-04-18 SURGICAL SUPPLY — 27 items
BNDG GAUZE ELAST 4 BULKY (GAUZE/BANDAGES/DRESSINGS) IMPLANT
CANISTER SUCT 3000ML PPV (MISCELLANEOUS) ×3 IMPLANT
COVER SURGICAL LIGHT HANDLE (MISCELLANEOUS) ×3 IMPLANT
DRAPE LAPAROSCOPIC ABDOMINAL (DRAPES) IMPLANT
DRAPE LAPAROTOMY 100X72 PEDS (DRAPES) IMPLANT
DRAPE UTILITY XL STRL (DRAPES) ×6 IMPLANT
DRSG PAD ABDOMINAL 8X10 ST (GAUZE/BANDAGES/DRESSINGS) IMPLANT
ELECT CAUTERY BLADE 6.4 (BLADE) ×3 IMPLANT
ELECT REM PT RETURN 9FT ADLT (ELECTROSURGICAL) ×3
ELECTRODE REM PT RTRN 9FT ADLT (ELECTROSURGICAL) ×1 IMPLANT
GAUZE SPONGE 4X4 12PLY STRL (GAUZE/BANDAGES/DRESSINGS) IMPLANT
GLOVE BIOGEL PI IND STRL 8 (GLOVE) ×1 IMPLANT
GLOVE BIOGEL PI INDICATOR 8 (GLOVE) ×2
GLOVE ECLIPSE 8.0 STRL XLNG CF (GLOVE) ×3 IMPLANT
GOWN STRL REUS W/ TWL LRG LVL3 (GOWN DISPOSABLE) ×2 IMPLANT
GOWN STRL REUS W/TWL LRG LVL3 (GOWN DISPOSABLE) ×4
KIT BASIN OR (CUSTOM PROCEDURE TRAY) ×3 IMPLANT
KIT ROOM TURNOVER OR (KITS) ×3 IMPLANT
NEEDLE HYPO 25GX1X1/2 BEV (NEEDLE) ×3 IMPLANT
NS IRRIG 1000ML POUR BTL (IV SOLUTION) ×3 IMPLANT
PACK GENERAL/GYN (CUSTOM PROCEDURE TRAY) ×3 IMPLANT
PAD ARMBOARD 7.5X6 YLW CONV (MISCELLANEOUS) ×3 IMPLANT
SWAB COLLECTION DEVICE MRSA (MISCELLANEOUS) IMPLANT
SWAB CULTURE ESWAB REG 1ML (MISCELLANEOUS) IMPLANT
SYR CONTROL 10ML LL (SYRINGE) ×3 IMPLANT
TOWEL OR 17X24 6PK STRL BLUE (TOWEL DISPOSABLE) ×3 IMPLANT
TOWEL OR 17X26 10 PK STRL BLUE (TOWEL DISPOSABLE) ×3 IMPLANT

## 2017-04-18 NOTE — Progress Notes (Signed)
PROGRESS NOTE    Gail King  ZOX:[id number] DOB: 1966-01-18 DOA: 04/16/2017 PCP: System, Pcp Not In     Brief Narrative:  Gail King is a 51 y.o. 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  Patient had been admitted to [facility] from 03/28/2017 until 04/13/2017 for IV diuresis as she had developed anasarca, likely secondary to nephropathy with serum albumin of 1.1.  She underwent IV diuresis and had a mild increase in her creatinine at that time.  She had profuse watery diarrhea with negative C. difficile testing, attributed to cyclosporine and magnesium oxide.  Cyclosporine was changed to Prograf and her diuretics were adjusted in light of the worsening renal function.  She was discharged in much improved and stable condition, but has since developed fevers and chills with purulent drainage from the upper right breast.    Assessment & Plan:   Principal Problem:   Cellulitis of right breast Active Problems:   Hypoalbuminemia   Watery diarrhea   AKI (acute kidney injury) (HCC)   Membranous nephrosis   Acute kidney injury (HCC)   Cellulitis of breast   Sepsis secondary to cellulitis right breast -General surgery following -[place] pending results  -Blood culture pending  -Continue empiric vanco  -?Shingles, start empiric acyclovir as patient immunosuppressed  -S/p I&D 10/30, wound culture pending. Hopefully can narrow down antibiotics   Nephrotic syndrome/membranous GN  -Diagnosed by biopsy in 01/2017. Recently admitted at [facility] (10/9-10/25) for anasarca, diuresed with IV lasix, switched to torsemide 20mg  BID, aldactone 12.5mg  daily -Switched from cyclosporine, prednisone to prograf and prednisone  -Coumadin for hypercoagulable  state, per pharmacy  -Holding cozaar and diuretics for now due to worsening kidney function  -Cr on discharge from [facility] was 1.5. Now 1.9 --> 2.26 --> 2.68 -Weight at [facility] on 10/25 was 232lb. She is 228lb now on admission -Consult Nephrology due to worsening kidney function   Diarrhea -Previously thought to be secondary to cyclosporine, magnesium oxide use. Has hx of IBS-D.  -C Diff, GI PCR pending   DM type 2 -Hold glucotrol -SSI   Major depression, recurrent/panic disorder/insomnia -Continue prozac, seroquel    DVT prophylaxis: Coumadin Code Status: Full Family Communication: No family at bedside Disposition Plan: Pending improvement    Consultants:   General surgery  Nephrology  Procedures:   S/p I&D 10/30, wound culture pending. Hopefully can narrow down antibiotics   Antimicrobials:  Anti-infectives    Start     Dose/Rate Route Frequency Ordered Stop   04/17/17 1900  vancomycin (VANCOCIN) IVPB 1000 mg/200 mL premix     1,000 mg 200 mL/hr over 60 Minutes Intravenous Every 24 hours 04/16/17 2211     04/17/17 1600  acyclovir (ZOVIRAX) 200 MG capsule 800 mg  Status:  Discontinued     800 mg Oral 5 times daily 04/17/17 1504 04/17/17 1523   04/17/17 1600  acyclovir (ZOVIRAX) tablet 800 mg     800 mg Oral 5 times daily 04/17/17 1523     04/16/17 1830  vancomycin (VANCOCIN) IVPB 1000 mg/200 mL premix     1,000 mg 200 mL/hr over 60 Minutes Intravenous  Once 04/16/17 1819 04/16/17 1932       Subjective: Underwent I&D this morning. Overall states she is feeling well, pain well controlled. Continues to  have diarrhea.   Objective: Vitals:   04/18/17 1325 04/18/17 1340 04/18/17 1355 04/18/17 1357  BP: 113/74 114/66 106/68 110/80  Pulse: 87 94 92 92  Resp: 12 15 17 13   Temp:    (!) 97.5 F (36.4 C)  TempSrc:      SpO2: 95% 95% 94% 95%  Weight:      Height:        Intake/Output Summary (Last 24 hours) at 04/18/17 1440 Last data filed at  04/18/17 1401  Gross per 24 hour  Intake              775 ml  Output              110 ml  Net              665 ml   Filed Weights   04/16/17 1625 04/16/17 2137 04/18/17 0559  Weight: 105.2 kg (232 lb) 103.8 kg (228 lb 14.4 oz) 103.8 kg (228 lb 12.8 oz)    Examination:  General exam: Appears calm and comfortable  Respiratory system: Clear to auscultation. Respiratory effort normal. Cardiovascular system: S1 & S2 heard, RRR. No JVD, murmurs, rubs, gallops or clicks. +2 pedal edema. Gastrointestinal system: Abdomen is nondistended, soft and nontender. No organomegaly or masses felt. Normal bowel sounds heard. +Anasarca  Central nervous system: Alert and oriented. No focal neurological deficits. Extremities: Symmetric 5 x 5 power. Skin: S/p I&D, dressing in place  Psychiatry: Judgement and insight appear normal. Mood & affect appropriate.   Data Reviewed: I have personally reviewed following labs and imaging studies  CBC:  Recent Labs Lab 04/16/17 1628 04/17/17 0545 04/18/17 0539  WBC 12.8* 9.9 13.3*  NEUTROABS 10.4* 7.7  --   HGB 13.3 11.3* 12.0  HCT 40.1 34.0* 35.9*  MCV 89.9 89.7 90.2  PLT 253 223 289   Basic Metabolic Panel:  Recent Labs Lab 04/16/17 1628 04/17/17 0545 04/17/17 1256 04/18/17 0539  NA 138 140  --  138  K 3.5 3.2*  --  3.9  CL 104 107  --  103  CO2 27 24  --  25  GLUCOSE 155* 93  --  96  BUN 37* 36*  --  42*  CREATININE 1.90* 2.26*  --  2.68*  CALCIUM 7.5* 7.0*  --  7.2*  MG  --   --  1.3* 1.7   GFR: Estimated Creatinine Clearance: 28.8 mL/min (A) (by C-G formula based on SCr of 2.68 mg/dL (H)). Liver Function Tests:  Recent Labs Lab 04/16/17 1628 04/17/17 0545  AST 20 12*  ALT 18 12*  ALKPHOS 109 83  BILITOT 0.9 0.9  PROT 5.1* 4.1*  ALBUMIN 1.1* <1.0*   No results for input(s): LIPASE, AMYLASE in the last 168 hours. No results for input(s): AMMONIA in the last 168 hours. Coagulation Profile:  Recent Labs Lab 04/16/17 2245  04/17/17 0545 04/18/17 0539  INR 2.75 2.55 2.08   Cardiac Enzymes: No results for input(s): CKTOTAL, CKMB, CKMBINDEX, TROPONINI in the last 168 hours. BNP (last 3 results) No results for input(s): PROBNP in the last 8760 hours. HbA1C: No results for input(s): HGBA1C in the last 72 hours. CBG:  Recent Labs Lab 04/17/17 1736 04/17/17 2146 04/18/17 0757 04/18/17 1025 04/18/17 1257  GLUCAP 138* 145* 94 96 84   Lipid Profile: No results for input(s): CHOL, HDL, LDLCALC, TRIG, CHOLHDL, LDLDIRECT in the last 72 hours. Thyroid Function Tests: No results for input(s): TSH, T4TOTAL, FREET4, T3FREE, THYROIDAB in  the last 72 hours. Anemia Panel: No results for input(s): VITAMINB12, FOLATE, FERRITIN, TIBC, IRON, RETICCTPCT in the last 72 hours. Sepsis Labs:  Recent Labs Lab 04/16/17 1651  LATICACIDVEN 0.97    No results found for this or any previous visit (from the past 240 hour(s)).     Radiology Studies: Dg Chest 2 View  Result Date: 04/16/2017 CLINICAL DATA:  Fever with abscess to the right axilla EXAM: CHEST  2 VIEW COMPARISON:  None. FINDINGS: Small left greater than right pleural effusion. Hazy atelectasis or infiltrate at the left base. Normal heart size. No pneumothorax. Old right fifth rib fracture IMPRESSION: Small left greater than right pleural effusion with left basilar atelectasis or infiltrate Electronically Signed   By: Jasmine Pang M.D.   On: 04/16/2017 17:10   Ct Chest Wo Contrast  Result Date: 04/16/2017 CLINICAL DATA:  Chest wall pain. Recent diagnosis of nephrotic syndrome. Patient on immunosuppressants and steroids. Presents today with severe pain in her right breast with redness and drainage. EXAM: CT CHEST WITHOUT CONTRAST TECHNIQUE: Multidetector CT imaging of the chest was performed following the standard protocol without IV contrast. COMPARISON:  Chest x-ray from earlier today FINDINGS: Cardiovascular: Coronary artery calcifications are identified. The  heart size is normal. The thoracic aorta demonstrates mild atherosclerotic change. No aneurysm identified. Central pulmonary arteries are normal in caliber. Mediastinum/Nodes: Small bilateral pleural effusions are seen. A tiny amount of pericardial fluid is noted. The thyroid and esophagus are normal. No adenopathy in the mediastinum or hila. Increased subcutaneous edema identified. There is skin thickening associated with both breasts. No definitive abscess or mass is seen. Lungs/Pleura: Central airways are normal. No pneumothorax. No overt edema. Atelectasis underlies the bilateral effusions. No suspicious infiltrate to suggest pneumonia. No suspicious nodules or masses. Upper Abdomen: No acute abnormality. Musculoskeletal: No chest wall mass or suspicious bone lesions identified. IMPRESSION: 1. The patient's symptoms of breast pain with redness and drainage could be seen with edema, mastitis, or inflammatory breast cancer. No obvious abscess or mass seen on today's imaging. However, mammography and ultrasound would be the studies of choice rather than CT. Recommend referral to the Breast Center for further evaluation. 2. Pleural effusions and subcutaneous edema likely represent volume overload. No pulmonary edema identified. 3. Coronary artery calcifications. Mild atherosclerotic change in the thoracic aorta. 4. No other acute abnormalities. Aortic Atherosclerosis (ICD10-I70.0). Electronically Signed   By: Gerome Sam III M.D   On: 04/16/2017 20:53      Scheduled Meds: . acyclovir  800 mg Oral 5 X Daily  . dicyclomine  10 mg Oral TID AC & HS  . FLUoxetine  10 mg Oral Daily  . HYDROmorphone      . insulin aspart  0-5 Units Subcutaneous QHS  . insulin aspart  0-9 Units Subcutaneous TID WC  . ondansetron      . predniSONE  10 mg Oral Q breakfast  . QUEtiapine  25 mg Oral QHS  . sodium chloride flush  3 mL Intravenous Q12H  . tacrolimus  1 mg Oral BID  . warfarin  5 mg Oral q1800  . Warfarin -  Pharmacist Dosing Inpatient   Does not apply q1800   Continuous Infusions: . sodium chloride 10 mL/hr at 04/18/17 1103  . vancomycin Stopped (04/17/17 2100)     LOS: 2 days    Time spent: 30 minutes   Noralee Stain, DO [facility] [email or web address] Password [email or web address] 04/18/2017, 2:40 PM

## 2017-04-18 NOTE — Op Note (Signed)
IRRIGATION AND DEBRIDEMENT ABSCESS  Procedure Note  Gail King 04/16/2017 - 04/18/2017   Pre-op Diagnosis: right axillary abscess     Post-op Diagnosis: same  Procedure(s): INCISION AND DRAINAGE RIGHT AXILLARY ABSCESS  Surgeon(s): Abigail MiyamotoBlackman, Dawaun Brancato, MD  Anesthesia: General  Staff:  Circulator: Woodroe ModeHickling, Kelly A, RN; Marvia PicklesKidibu, Christelle, RN Relief Circulator: Jasper RilingHooper, Barbara, RN Scrub Person: Sofie Rowerove, Megan C, RN Float Surgical Tech: Carmela RimaLeggio, Rebecca K  Estimated Blood Loss: Minimal               Cultures sent  Findings: The patient was found to have 2 moderate-sized axillary abscesses.  One was medial and one was lateral.  I created a communication between the 2.  Cultures were obtained  Procedure: The patient was brought to the operating room and identified as the correct patient.  She was placed supine on the operating table and general anesthesia was induced.  Right axilla and breast were then prepped and draped in the usual sterile fashion.  I anesthetized the skin and 2 separate areas at the maximum amount of induration with Marcaine.  I then made 2 separate skin incisions with a scalpel and entered abscess cavities.  I obtained cultures of the purulence.  By finger fracturing, I was able to make the 2 incisions communicate through the axilla and breast.  I then irrigated the areas with normal saline.  Hemostasis appeared to be achieved.  I then packed both wounds with wet-to-dry Kerlix with saline soaked gauze.  Dry gauze and tape were then placed over this.  The patient tolerated procedure well.  All the counts were correct at the end of the procedure.  The patient was the next made in the operating room and taken in a stable condition to the recovery room.          Alene Bergerson A   Date: 04/18/2017  Time: 12:21 PM

## 2017-04-18 NOTE — Progress Notes (Signed)
Subjective/Chief Complaint: Pt still having moderate to severe right axillary pain, not improved   Objective: Vital signs in last 24 hours: Temp:  [97.8 F (36.6 C)-98.4 F (36.9 C)] 97.8 F (36.6 C) (10/30 0521) Pulse Rate:  [82-85] 85 (10/30 0521) Resp:  [1-17] 1 (10/30 0521) BP: (110-118)/(53-65) 118/65 (10/30 0521) SpO2:  [95 %-97 %] 96 % (10/30 0521) Weight:  [103.8 kg (228 lb 12.8 oz)] 103.8 kg (228 lb 12.8 oz) (10/30 0559) Last BM Date: 04/17/17  Intake/Output from previous day: 10/29 0701 - 10/30 0700 In: 203 [P.O.:150; I.V.:3; IV Piggyback:50] Out: 200 [Urine:200] Intake/Output this shift: No intake/output data recorded.  Exam: Cellulitis and induration in right axilla is not improved. Tenderness to my exam is greater  Lab Results:   Recent Labs  04/17/17 0545 04/18/17 0539  WBC 9.9 13.3*  HGB 11.3* 12.0  HCT 34.0* 35.9*  PLT 223 289   BMET  Recent Labs  04/17/17 0545 04/18/17 0539  NA 140 138  K 3.2* 3.9  CL 107 103  CO2 24 25  GLUCOSE 93 96  BUN 36* 42*  CREATININE 2.26* 2.68*  CALCIUM 7.0* 7.2*   PT/INR  Recent Labs  04/17/17 0545 04/18/17 0539  LABPROT 27.2* 23.2*  INR 2.55 2.08   ABG No results for input(s): PHART, HCO3 in the last 72 hours.  Invalid input(s): PCO2, PO2  Studies/Results: Dg Chest 2 View  Result Date: 04/16/2017 CLINICAL DATA:  Fever with abscess to the right axilla EXAM: CHEST  2 VIEW COMPARISON:  None. FINDINGS: Small left greater than right pleural effusion. Hazy atelectasis or infiltrate at the left base. Normal heart size. No pneumothorax. Old right fifth rib fracture IMPRESSION: Small left greater than right pleural effusion with left basilar atelectasis or infiltrate Electronically Signed   By: Jasmine Pang M.D.   On: 04/16/2017 17:10   Ct Chest Wo Contrast  Result Date: 04/16/2017 CLINICAL DATA:  Chest wall pain. Recent diagnosis of nephrotic syndrome. Patient on immunosuppressants and steroids.  Presents today with severe pain in her right breast with redness and drainage. EXAM: CT CHEST WITHOUT CONTRAST TECHNIQUE: Multidetector CT imaging of the chest was performed following the standard protocol without IV contrast. COMPARISON:  Chest x-ray from earlier today FINDINGS: Cardiovascular: Coronary artery calcifications are identified. The heart size is normal. The thoracic aorta demonstrates mild atherosclerotic change. No aneurysm identified. Central pulmonary arteries are normal in caliber. Mediastinum/Nodes: Small bilateral pleural effusions are seen. A tiny amount of pericardial fluid is noted. The thyroid and esophagus are normal. No adenopathy in the mediastinum or hila. Increased subcutaneous edema identified. There is skin thickening associated with both breasts. No definitive abscess or mass is seen. Lungs/Pleura: Central airways are normal. No pneumothorax. No overt edema. Atelectasis underlies the bilateral effusions. No suspicious infiltrate to suggest pneumonia. No suspicious nodules or masses. Upper Abdomen: No acute abnormality. Musculoskeletal: No chest wall mass or suspicious bone lesions identified. IMPRESSION: 1. The patient's symptoms of breast pain with redness and drainage could be seen with edema, mastitis, or inflammatory breast cancer. No obvious abscess or mass seen on today's imaging. However, mammography and ultrasound would be the studies of choice rather than CT. Recommend referral to the Breast Center for further evaluation. 2. Pleural effusions and subcutaneous edema likely represent volume overload. No pulmonary edema identified. 3. Coronary artery calcifications. Mild atherosclerotic change in the thoracic aorta. 4. No other acute abnormalities. Aortic Atherosclerosis (ICD10-I70.0). Electronically Signed   By: Gerome Sam III M.D  On: 04/16/2017 20:53    Anti-infectives: Anti-infectives    Start     Dose/Rate Route Frequency Ordered Stop   04/17/17 1900  vancomycin  (VANCOCIN) IVPB 1000 mg/200 mL premix     1,000 mg 200 mL/hr over 60 Minutes Intravenous Every 24 hours 04/16/17 2211     04/17/17 1600  acyclovir (ZOVIRAX) 200 MG capsule 800 mg  Status:  Discontinued     800 mg Oral 5 times daily 04/17/17 1504 04/17/17 1523   04/17/17 1600  acyclovir (ZOVIRAX) tablet 800 mg     800 mg Oral 5 times daily 04/17/17 1523     04/16/17 1830  vancomycin (VANCOCIN) IVPB 1000 mg/200 mL premix     1,000 mg 200 mL/hr over 60 Minutes Intravenous  Once 04/16/17 1819 04/16/17 1932      Assessment/Plan:  Right breast/axillary cellulitis  Her WBC has increased and her exam is worse to me so I recommend I and D in the OR today.  I explained the procedure to her in detail.  I explained the risks which include but are not limited to bleeding, ongoing infection, need for further surgery, injury to surrounding structures, etc.  She agrees to proceed.  LOS: 2 days    Mavis Fichera A 04/18/2017

## 2017-04-18 NOTE — Anesthesia Procedure Notes (Signed)
Procedure Name: LMA Insertion Date/Time: 04/18/2017 12:03 PM Performed by: Burt EkURNER, Jahlisa Rossitto ASHLEY Pre-anesthesia Checklist: Patient identified, Emergency Drugs available, Suction available and Patient being monitored Patient Re-evaluated:Patient Re-evaluated prior to induction Oxygen Delivery Method: Circle system utilized Preoxygenation: Pre-oxygenation with 100% oxygen Induction Type: IV induction Ventilation: Mask ventilation without difficulty LMA: LMA inserted LMA Size: 4.0 Number of attempts: 1 Placement Confirmation: positive ETCO2 and breath sounds checked- equal and bilateral Tube secured with: Tape Dental Injury: Teeth and Oropharynx as per pre-operative assessment

## 2017-04-18 NOTE — Progress Notes (Signed)
Report given to the nurse in the OR. All questions by nurse was answered.

## 2017-04-18 NOTE — Progress Notes (Signed)
[facility] Progress Note    Assessment/ Plan:   1.  Acute kidney injury superimposed on CKD: AKI likely hemodynamically mediated in setting of sepsis/ diuresis/ third spacing.  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  She has fairly severe nephrotic syndrome with her albumin in the 1s.  As such, empiric anticoagulation is appropriate.  The natural history of MN is variable- some individuals spontaneously remit, others require medication, and still others have an unrelenting course which ends in ESRD.  She was diagnosed < 6 months ago.  For now, appropriate to continue CNI and steroids.  If no improvement, would consider cyclophosphamide as an outpatient.  Would not re-pulse with steroids given infection.  Agree with holding ARB.  Will get tac trough.  Due to increasing Cr have decreased tac to 1 mg BID  2.  Anasarca: I expect her to third space.  She may need an albumin/ Lasix combination when diuretics are resumed; for now she needs gentle IVF in the setting of having been NPO and acyclovir.  3.  Breast cellulitis/ shingles: on vanc, acyclovir.  Please consider transitioning to Valtrex at a reduced dose if possible as even PO acyclovir can cause a crystallopathy.  Subjective:    S/p R breast I and D.  Cr a little worse.     Objective:   BP 118/68 (BP Location: Right Arm)   Pulse 90   Temp 98.7 F (37.1 C) (Oral)   Resp 18   Ht 5\' 4"  (1.626 m)   Wt 103.8 kg (228 lb 12.8 oz)   SpO2 94%   BMI 39.27 kg/m   Intake/Output Summary (Last 24 hours) at 04/18/17 1645 Last data filed at 04/18/17 1401  Gross per 24 hour  Intake              725 ml  Output              110 ml  Net              615 ml   Weight change: -1.452 kg (-3 lb 3.2 oz)  Physical Exam: GEN pale, NAD HEENT EOMI, PERRL, MMM NECK no JVD BREAST: R breast dressed, dressings c/d/i PULM clear bilaterally  CV RRR no m/r/g ABD some mild abd wall edema EXT 1+ upper and LE  edema NEURO AAO x 3  Imaging: Dg Chest 2 View  Result Date: 04/16/2017 CLINICAL DATA:  Fever with abscess to the right axilla EXAM: CHEST  2 VIEW COMPARISON:  None. FINDINGS: Small left greater than right pleural effusion. Hazy atelectasis or infiltrate at the left base. Normal heart size. No pneumothorax. Old right fifth rib fracture IMPRESSION: Small left greater than right pleural effusion with left basilar atelectasis or infiltrate Electronically Signed   By: Jasmine PangKim  Fujinaga M.D.   On: 04/16/2017 17:10   Ct Chest Wo Contrast  Result Date: 04/16/2017 CLINICAL DATA:  Chest wall pain. Recent diagnosis of nephrotic syndrome. Patient on immunosuppressants and steroids. Presents today with severe pain in her right breast with redness and drainage. EXAM: CT CHEST WITHOUT CONTRAST TECHNIQUE: Multidetector CT imaging of the chest was performed following the standard protocol without IV contrast. COMPARISON:  Chest x-ray from earlier today FINDINGS: Cardiovascular: Coronary artery calcifications are identified. The heart size is normal. The thoracic aorta demonstrates mild atherosclerotic change. No aneurysm identified. Central pulmonary arteries are normal in caliber. Mediastinum/Nodes: Small bilateral pleural effusions are seen. A tiny amount of pericardial fluid is noted. The  thyroid and esophagus are normal. No adenopathy in the mediastinum or hila. Increased subcutaneous edema identified. There is skin thickening associated with both breasts. No definitive abscess or mass is seen. Lungs/Pleura: Central airways are normal. No pneumothorax. No overt edema. Atelectasis underlies the bilateral effusions. No suspicious infiltrate to suggest pneumonia. No suspicious nodules or masses. Upper Abdomen: No acute abnormality. Musculoskeletal: No chest wall mass or suspicious bone lesions identified. IMPRESSION: 1. The patient's symptoms of breast pain with redness and drainage could be seen with edema, mastitis, or  inflammatory breast cancer. No obvious abscess or mass seen on today's imaging. However, mammography and ultrasound would be the studies of choice rather than CT. Recommend referral to the Breast Center for further evaluation. 2. Pleural effusions and subcutaneous edema likely represent volume overload. No pulmonary edema identified. 3. Coronary artery calcifications. Mild atherosclerotic change in the thoracic aorta. 4. No other acute abnormalities. Aortic Atherosclerosis (ICD10-I70.0). Electronically Signed   By: Gerome Sam III M.D   On: 04/16/2017 20:53    Labs: BMET  Recent Labs Lab 04/16/17 1628 04/17/17 0545 04/18/17 0539  NA 138 140 138  K 3.5 3.2* 3.9  CL 104 107 103  CO2 27 24 25   GLUCOSE 155* 93 96  BUN 37* 36* 42*  CREATININE 1.90* 2.26* 2.68*  CALCIUM 7.5* 7.0* 7.2*   CBC  Recent Labs Lab 04/16/17 1628 04/17/17 0545 04/18/17 0539  WBC 12.8* 9.9 13.3*  NEUTROABS 10.4* 7.7  --   HGB 13.3 11.3* 12.0  HCT 40.1 34.0* 35.9*  MCV 89.9 89.7 90.2  PLT 253 223 289    Medications:    . acyclovir  800 mg Oral 5 X Daily  . dicyclomine  10 mg Oral TID AC & HS  . FLUoxetine  10 mg Oral Daily  . HYDROmorphone      . insulin aspart  0-5 Units Subcutaneous QHS  . insulin aspart  0-9 Units Subcutaneous TID WC  . ondansetron      . predniSONE  10 mg Oral Q breakfast  . QUEtiapine  25 mg Oral QHS  . sodium chloride flush  3 mL Intravenous Q12H  . tacrolimus  1 mg Oral BID  . warfarin  5 mg Oral q1800  . Warfarin - Pharmacist Dosing Inpatient   Does not apply q1800      Bufford Buttner, MD [facility] pgr [phone] 04/18/2017, 4:45 PM

## 2017-04-18 NOTE — Transfer of Care (Signed)
Immediate Anesthesia Transfer of Care Note  Patient: Gail King  Procedure(s) Performed: IRRIGATION AND DEBRIDEMENT ABSCESS (Right )  Patient Location: PACU  Anesthesia Type:General  Level of Consciousness: awake, alert , oriented and patient cooperative  Airway & Oxygen Therapy: Patient Spontanous Breathing  Post-op Assessment: Report given to RN, Post -op Vital signs reviewed and stable and Patient moving all extremities X 4  Post vital signs: Reviewed and stable  Last Vitals:  Vitals:   04/18/17 0521 04/18/17 1254  BP: 118/65   Pulse: 85   Resp: (!) 1   Temp: 36.6 C (!) (P) 36.1 C  SpO2: 96%     Last Pain:  Vitals:   04/18/17 0521  TempSrc: Oral  PainSc:       Patients Stated Pain Goal: 4 (04/17/17 2245)  Complications: No apparent anesthesia complications

## 2017-04-18 NOTE — Anesthesia Preprocedure Evaluation (Signed)
Anesthesia Evaluation  Patient identified by MRN, date of birth, ID band Patient awake    Reviewed: Allergy & Precautions, NPO status , Patient's Chart, lab work & pertinent test results  Airway Mallampati: I  TM Distance: >3 FB Neck ROM: Full    Dental   Pulmonary former smoker,    Pulmonary exam normal        Cardiovascular hypertension, Pt. on medications Normal cardiovascular exam     Neuro/Psych    GI/Hepatic   Endo/Other  diabetes  Renal/GU      Musculoskeletal   Abdominal   Peds  Hematology   Anesthesia Other Findings   Reproductive/Obstetrics                             Anesthesia Physical Anesthesia Plan  ASA: II  Anesthesia Plan: General   Post-op Pain Management:    Induction: Intravenous  PONV Risk Score and Plan: 3 and Ondansetron, Dexamethasone, Midazolam and Treatment may vary due to age or medical condition  Airway Management Planned: LMA  Additional Equipment:   Intra-op Plan:   Post-operative Plan: Extubation in OR  Informed Consent: I have reviewed the patients History and Physical, chart, labs and discussed the procedure including the risks, benefits and alternatives for the proposed anesthesia with the patient or authorized representative who has indicated his/her understanding and acceptance.     Plan Discussed with: CRNA and Surgeon  Anesthesia Plan Comments:         Anesthesia Quick Evaluation

## 2017-04-18 NOTE — Progress Notes (Signed)
PHARMACY CONSULT NOTE  Consulted to change acyclovir to valacyclovir for shingles. Also asked to review medication for DDI.  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 Last dose of acyclovir was 10/29.  D/C acyclovir Begin valacyclovir 1 g PO q24h No DDI seen with current medications  Loura BackJennifer Galesburg, PharmD, BCPS Clinical Pharmacist Phone for today [phone]- [phone] Main pharmacy - [phone] 04/18/2017 5:09 PM

## 2017-04-18 NOTE — Progress Notes (Signed)
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  No Known Allergies  Patient Measurements: Height: 5\' 4"  (162.6 cm) Weight: 228 lb 12.8 oz (103.8 kg) IBW/kg (Calculated) : 54.7 Vital Signs: Temp: 97.5 F (36.4 C) (10/30 1357) Temp Source: Oral (10/30 0521) BP: 110/80 (10/30 1357) Pulse Rate: 92 (10/30 1357)  Labs:  Recent Labs  04/16/17 1628 04/16/17 2245 04/17/17 0545 04/18/17 0539  HGB 13.3  --  11.3* 12.0  HCT 40.1  --  34.0* 35.9*  PLT 253  --  223 289  LABPROT  --  28.9* 27.2* 23.2*  INR  --  2.75 2.55 2.08  CREATININE 1.90*  --  2.26* 2.68*    Estimated Creatinine Clearance: 28.8 mL/min (A) (by C-G formula based on SCr of 2.68 mg/dL (H)).   Medical History: Past Medical History:  Diagnosis Date  . Diabetes mellitus without complication (HCC)   . Hypertension   . Renal disorder     [name] presents to the ED with right breast cellulitis, on warfarin PTA for hypercoagulable state.  INR remains therapeutic although trending down.  Hesitant to increase dose tonight given OR today for I&D.  Will continue to follow trend on home Coumadin dose, CBC good.   Warfarin PTA dosing: 5 mg daily   Goal of Therapy:  INR 2-3 Monitor platelets by anticoagulation protocol: Yes   Plan:  Warfarin 5 mg daily at 1800 Daily PT/INR Monitor for bleeding   Toys 'R' UsKimberly Mykah Bellomo, Pharm.D., BCPS Clinical Pharmacist Pager: [phone] Clinical phone for 04/18/2017 from 8:30-4:00 is [phone]. After 4pm, please call Main Rx [phone]) for assistance. 04/18/2017 2:02 PM

## 2017-04-18 NOTE — Anesthesia Postprocedure Evaluation (Signed)
Anesthesia Post Note  Patient: Gail King  Procedure(s) Performed: IRRIGATION AND DEBRIDEMENT ABSCESS (Right )     Patient location during evaluation: PACU Anesthesia Type: General Level of consciousness: awake and alert Pain management: pain level controlled Vital Signs Assessment: post-procedure vital signs reviewed and stable Respiratory status: spontaneous breathing, nonlabored ventilation, respiratory function stable and patient connected to nasal cannula oxygen Cardiovascular status: blood pressure returned to baseline and stable Postop Assessment: no apparent nausea or vomiting Anesthetic complications: no    Last Vitals:  Vitals:   04/18/17 1355 04/18/17 1357  BP: 106/68 110/80  Pulse: 92 92  Resp: 17 13  Temp:  (!) 36.4 C  SpO2: 94% 95%    Last Pain:  Vitals:   04/18/17 1340  TempSrc:   PainSc: Asleep                 Kodah Maret DAVID

## 2017-04-19 ENCOUNTER — Encounter (HOSPITAL_COMMUNITY): Payer: Self-pay | Admitting: Surgery

## 2017-04-19 DIAGNOSIS — N61 Mastitis without abscess: Secondary | ICD-10-CM

## 2017-04-19 DIAGNOSIS — E8809 Other disorders of plasma-protein metabolism, not elsewhere classified: Secondary | ICD-10-CM

## 2017-04-19 DIAGNOSIS — N611 Abscess of the breast and nipple: Secondary | ICD-10-CM

## 2017-04-19 DIAGNOSIS — N179 Acute kidney failure, unspecified: Secondary | ICD-10-CM

## 2017-04-19 DIAGNOSIS — N048 Nephrotic syndrome with other morphologic changes: Secondary | ICD-10-CM

## 2017-04-19 LAB — CBC
HCT: 32.2 % — ABNORMAL LOW (ref 36.0–46.0)
HEMOGLOBIN: 10.6 g/dL — AB (ref 12.0–15.0)
MCH: 29.4 pg (ref 26.0–34.0)
MCHC: 32.9 g/dL (ref 30.0–36.0)
MCV: 89.4 fL (ref 78.0–100.0)
PLATELETS: 306 10*3/uL (ref 150–400)
RBC: 3.6 MIL/uL — AB (ref 3.87–5.11)
RDW: 17.1 % — ABNORMAL HIGH (ref 11.5–15.5)
WBC: 11.3 10*3/uL — AB (ref 4.0–10.5)

## 2017-04-19 LAB — GLUCOSE, CAPILLARY
GLUCOSE-CAPILLARY: 159 mg/dL — AB (ref 65–99)
GLUCOSE-CAPILLARY: 165 mg/dL — AB (ref 65–99)
GLUCOSE-CAPILLARY: 140 mg/dL — AB (ref 65–99)
Glucose-Capillary: 131 mg/dL — ABNORMAL HIGH (ref 65–99)

## 2017-04-19 LAB — BASIC METABOLIC PANEL
ANION GAP: 9 (ref 5–15)
BUN: 44 mg/dL — AB (ref 6–20)
CHLORIDE: 107 mmol/L (ref 101–111)
CO2: 21 mmol/L — ABNORMAL LOW (ref 22–32)
Calcium: 6.7 mg/dL — ABNORMAL LOW (ref 8.9–10.3)
Creatinine, Ser: 2.71 mg/dL — ABNORMAL HIGH (ref 0.44–1.00)
GFR calc Af Amer: 22 mL/min — ABNORMAL LOW (ref >60)
GFR, EST NON AFRICAN AMERICAN: 19 mL/min — AB (ref >60)
GLUCOSE: 157 mg/dL — AB (ref 65–99)
POTASSIUM: 4 mmol/L (ref 3.5–5.1)
SODIUM: 137 mmol/L (ref 135–145)

## 2017-04-19 LAB — PROTIME-INR
INR: 2.42
Prothrombin Time: 26.1 seconds — ABNORMAL HIGH (ref 11.4–15.2)

## 2017-04-19 LAB — MAGNESIUM: MAGNESIUM: 1.7 mg/dL (ref 1.7–2.4)

## 2017-04-19 MED ORDER — DIPHENHYDRAMINE HCL 25 MG PO CAPS
25.0000 mg | ORAL_CAPSULE | Freq: Four times a day (QID) | ORAL | Status: DC | PRN
  Administered 2017-04-19 – 2017-04-21 (×3): 25 mg via ORAL
  Filled 2017-04-19 (×4): qty 1

## 2017-04-19 NOTE — Progress Notes (Signed)
Patient has not had a bowel movement today, I was unable to get a stool sample.

## 2017-04-19 NOTE — Progress Notes (Signed)
[facility] Progress Note  1 Day Post-Op  Subjective: CC:  Axillary pain, redness, and pressure improved s/p drainage. Denies fever, chills, nausea, vomiting.   Objective: Vital signs in last 24 hours: Temp:  [97 F (36.1 C)-98.7 F (37.1 C)] 98 F (36.7 C) (10/31 0617) Pulse Rate:  [78-94] 78 (10/31 0617) Resp:  [12-20] 18 (10/31 0617) BP: (106-136)/(66-83) 131/77 (10/31 0617) SpO2:  [94 %-96 %] 96 % (10/31 0617) Last BM Date: 04/18/17  Intake/Output from previous day: 10/30 0701 - 10/31 0700 In: 1334.8 [I.V.:1259.8] Out: 310 [Urine:300; Blood:10] Intake/Output this shift: No intake/output data recorded.  PE: Gen:  Alert, NAD, cooperative  Pulm: normal respiratory effort  Skin/Right axillary wound: one 4-5 cm proximal incision between axilla and proximal breast with a counter incision inferiorly around anterior axillary line 3-4 cm. There is >10 cm cellulitis surrounding the incisions, improved compared to previous exams. No residual fluctuance. Appropriately tender. Packing removed and dressing changed at bedside.    Lab Results:   Recent Labs  04/18/17 0539 04/19/17 0525  WBC 13.3* 11.3*  HGB 12.0 10.6*  HCT 35.9* 32.2*  PLT 289 306   BMET  Recent Labs  04/18/17 0539 04/19/17 0525  NA 138 137  K 3.9 4.0  CL 103 107  CO2 25 21*  GLUCOSE 96 157*  BUN 42* 44*  CREATININE 2.68* 2.71*  CALCIUM 7.2* 6.7*   PT/INR  Recent Labs  04/18/17 0539 04/19/17 0525  LABPROT 23.2* 26.1*  INR 2.08 2.42   CMP     Component Value Date/Time   NA 137 04/19/2017 0525   K 4.0 04/19/2017 0525   CL 107 04/19/2017 0525   CO2 21 (L) 04/19/2017 0525   GLUCOSE 157 (H) 04/19/2017 0525   BUN 44 (H) 04/19/2017 0525   CREATININE 2.71 (H) 04/19/2017 0525   CALCIUM 6.7 (L) 04/19/2017 0525   PROT 4.1 (L) 04/17/2017 0545   ALBUMIN <1.0 (L) 04/17/2017 0545   AST 12 (L) 04/17/2017 0545   ALT 12 (L) 04/17/2017 0545   ALKPHOS 83 04/17/2017 0545   BILITOT 0.9  04/17/2017 0545   GFRNONAA 19 (L) 04/19/2017 0525   GFRAA 22 (L) 04/19/2017 0525   Lipase  No results found for: LIPASE     Studies/Results: No results found.  Anti-infectives: Anti-infectives    Start     Dose/Rate Route Frequency Ordered Stop   04/18/17 1800  valACYclovir (VALTREX) tablet 1,000 mg     1,000 mg Oral Every 24 hours 04/18/17 1703     04/17/17 1900  vancomycin (VANCOCIN) IVPB 1000 mg/200 mL premix     1,000 mg 200 mL/hr over 60 Minutes Intravenous Every 24 hours 04/16/17 2211     04/17/17 1600  acyclovir (ZOVIRAX) 200 MG capsule 800 mg  Status:  Discontinued     800 mg Oral 5 times daily 04/17/17 1504 04/17/17 1523   04/17/17 1600  acyclovir (ZOVIRAX) tablet 800 mg  Status:  Discontinued     800 mg Oral 5 times daily 04/17/17 1523 04/18/17 1703   04/16/17 1830  vancomycin (VANCOCIN) IVPB 1000 mg/200 mL premix     1,000 mg 200 mL/hr over 60 Minutes Intravenous  Once 04/16/17 1819 04/16/17 1932     Assessment/Plan Right breast cellulitis/abscess S/p I&D right axillary abscess 10/30 Dr. Abigail Miyamotoouglas Blackman   - afebrile, VSS, WBC 11.3 from 13.3  - GS growing G+ cocci, follow cultures; continue IV abx for now;   - BID wet-to-dry dressing changes by  nursing staff starting 11/1; patient should be pre-medicated with oral pain meds 30-40 minutes before dressing changes and 0.5-1.0 mg dilaudid 5 mins before.   - PRN pain control   FEN: carb mod diet  ID: Vancomycin 10/28 >> (day#4) , acyclovir 10/29 >> (ok to discontinue antiviral tx from surgical perspective)  VTE: SCD's, warfarin per pharmacy Foley: none  Follow up: Dr. Abigail Miyamoto    LOS: 3 days    Adam Phenix , [facility] 04/19/2017, 8:53 AM Pager: [phone] Consults: [phone] Mon-Fri 7:00 am-4:30 pm Sat-Sun 7:00 am-11:30 am

## 2017-04-19 NOTE — Progress Notes (Signed)
[facility] Progress Note    Assessment/ Plan:   1.  Acute kidney injury superimposed on CKD: AKI likely hemodynamically mediated in setting of sepsis/ diuresis/ third spacing.  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  Gail King has fairly severe nephrotic syndrome with her albumin in the 1s.  As such, empiric anticoagulation is appropriate.  The natural history of MN is variable- some individuals spontaneously remit, others require medication, and still others have an unrelenting course which ends in ESRD.  Gail King was diagnosed < 6 months ago.  For now, appropriate to continue CNI and steroids.  If no improvement, would consider cyclophosphamide as an outpatient.  Would not re-pulse with steroids given infection.  Agree with holding ARB.  tac trough pending Due to increasing Cr have decreased tac to 1 mg BID  2.  Anasarca: largely unchanged.  Restart diuretics when appropriate.  3.  Breast cellulitis with abscess/ ? shingles: on vanc, acyclovir stopped and transitioned to valtrex. S/p I and d with gen surg, dressings changed today.  Cultures growing GPCs in clusters.  Subjective:    Feeling better this AM; cr essentially plateaued.  Cultures from I and D growing GPCs in clusters   Objective:   BP 131/77 (BP Location: Left Arm)   Pulse 78   Temp 98 F (36.7 C) (Oral)   Resp 18   Ht 5\' 4"  (1.626 m)   Wt 103.8 kg (228 lb 12.8 oz)   SpO2 96%   BMI 39.27 kg/m   Intake/Output Summary (Last 24 hours) at 04/19/17 1301 Last data filed at 04/19/17 1234  Gross per 24 hour  Intake            834.8 ml  Output              500 ml  Net            334.8 ml   Weight change:   Physical Exam: GEN NAD, looks like Gail King feels much better  HEENT EOMI, PERRL, MMM NECK no JVD BREAST: R breast dressed, dressings c/d/i PULM clear bilaterally, able to lay flat without difficulty CV RRR no m/r/g ABD NABS EXT 1+ upper and LE edema NEURO AAO x 3  Imaging: No results  found.  Labs: BMET  Recent Labs Lab 04/16/17 1628 04/17/17 0545 04/18/17 0539 04/19/17 0525  NA 138 140 138 137  K 3.5 3.2* 3.9 4.0  CL 104 107 103 107  CO2 27 24 25  21*  GLUCOSE 155* 93 96 157*  BUN 37* 36* 42* 44*  CREATININE 1.90* 2.26* 2.68* 2.71*  CALCIUM 7.5* 7.0* 7.2* 6.7*   CBC  Recent Labs Lab 04/16/17 1628 04/17/17 0545 04/18/17 0539 04/19/17 0525  WBC 12.8* 9.9 13.3* 11.3*  NEUTROABS 10.4* 7.7  --   --   HGB 13.3 11.3* 12.0 10.6*  HCT 40.1 34.0* 35.9* 32.2*  MCV 89.9 89.7 90.2 89.4  PLT 253 223 289 306    Medications:    . dicyclomine  10 mg Oral TID AC & HS  . FLUoxetine  10 mg Oral Daily  . insulin aspart  0-5 Units Subcutaneous QHS  . insulin aspart  0-9 Units Subcutaneous TID WC  . predniSONE  10 mg Oral Q breakfast  . QUEtiapine  25 mg Oral QHS  . sodium chloride flush  3 mL Intravenous Q12H  . tacrolimus  1 mg Oral BID  . valACYclovir  1,000 mg Oral Q24H  . warfarin  5 mg  Oral q1800  . Warfarin - Pharmacist Dosing Inpatient   Does not apply q1800      Bufford Buttner, MD [facility] pgr [phone] 04/19/2017, 1:01 PM

## 2017-04-19 NOTE — Progress Notes (Signed)
Patient stated symptoms of itchiness and MD was notified; waiting for an order to be put in.

## 2017-04-19 NOTE — Progress Notes (Signed)
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 Patient had been admitted to [facility] from 03/28/2017 until 04/13/2017 for IV diuresis as she had developed anasarca, likely secondary to nephropathy with serum albumin of 1.1. She underwent IV diuresis and had a mild increase in her creatinine at that time. She had profuse watery diarrhea with negative C. difficile testing, attributed to cyclosporine and magnesium oxide. Cyclosporine was changed to Prograf and her diuretics were adjusted in light of the worsening renal function. She was discharged in much improved and stable condition, but has since developed fevers and chills with purulent drainage from the upper right breast. She is status post I&D by general surgery on 10/30  Assessment & Plan: Principal Problem:   Cellulitis of right breast Active Problems:   Hypoalbuminemia   Watery diarrhea   AKI (acute kidney injury) (HCC)   Membranous nephrosis   Acute kidney injury (HCC)   Cellulitis of breast   Sepsis secondary to cellulitis right breast -General surgery following -Blood culture pending  -Continue empiric vanco  -?Shingles, start empiric anti-virals as patient immunosuppressed  -S/p I&D 10/30, wound culture pending, preliminary with gram-positive cocci/Staphylococcus aureus, continue vancomycin  Nephrotic syndrome/membranous GN  -Diagnosed by biopsy in 01/2017. Recently admitted at [facility] (10/9-10/25) for anasarca, diuresed with IV lasix, switched to torsemide 20mg  BID, aldactone 12.5mg   daily -Switched from cyclosporine, prednisone to prograf and prednisone  -Coumadin for hypercoagulable state, per pharmacy  -Holding cozaar and diuretics for now due to worsening kidney function  -Cr on discharge from [facility] was 1.5. Now 1.9 --> 2.26 --> 2.68 --> 2.71 -Weight at [facility] on 10/25 was 232lb.  -Consult Nephrology due to worsening kidney function , appreciate input, D/w Dr. Signe Colt today   Diarrhea -Previously thought to be secondary to cyclosporine, magnesium oxide use. Has hx of IBS-D.  -GI pathogen panel negative, C. difficile pending  DM type 2 -Hold glucotrol -SSI   Major depression, recurrent/panic disorder/insomnia -Continue prozac, seroquel    DVT prophylaxis: Coumadin Code Status: Full code Family Communication: no family bedside Disposition Plan: TBD  Consultants:   Nephrology   General surgery   Procedures:   I&D 10/30  Antimicrobials:  Vancomycin 10/28 >>   Subjective: - no chest pain, shortness of breath, no abdominal pain, nausea or vomiting.   Objective: Vitals:   04/18/17 1357 04/18/17 1442 04/18/17 2131 04/19/17 0617  BP: 110/80 118/68 136/83 131/77  Pulse: 92 90 90 78  Resp: 13 18 20 18   Temp: (!) 97.5 F (36.4 C) 98.7 F (37.1 C) 97.6 F (36.4 C) 98 F (36.7 C)  TempSrc:  Oral Oral Oral  SpO2: 95% 94% 95% 96%  Weight:      Height:        Intake/Output Summary (Last 24 hours) at 04/19/17 1436 Last data filed at 04/19/17 1234  Gross per 24 hour  Intake            759.8 ml  Output              500 ml  Net  259.8 ml   Filed Weights   04/16/17 1625 04/16/17 2137 04/18/17 0559  Weight: 105.2 kg (232 lb) 103.8 kg (228 lb 14.4 oz) 103.8 kg (228 lb 12.8 oz)    Examination:  Constitutional: NAD Eyes: PERRL, lids and conjunctivae normal Respiratory: clear to auscultation bilaterally, no wheezing, no crackles. Normal respiratory effort. Cardiovascular: Regular rate and rhythm, no murmurs / rubs  / gallops. 1-2+ LE edema. 2+ pedal pulses.  Abdomen: no tenderness. Bowel sounds positive.  Musculoskeletal: no clubbing / cyanosis.  Skin: large right axillary cellulitic area post I&D, 2 large incisions ~1.5 inch wide each Neurologic: CN 2-12 grossly intact. Strength 5/5 in all 4.  Psychiatric: Normal judgment and insight. Alert and oriented x 3. Normal mood.    Data Reviewed: I have independently reviewed following labs and imaging studies   CBC:  Recent Labs Lab 04/16/17 1628 04/17/17 0545 04/18/17 0539 04/19/17 0525  WBC 12.8* 9.9 13.3* 11.3*  NEUTROABS 10.4* 7.7  --   --   HGB 13.3 11.3* 12.0 10.6*  HCT 40.1 34.0* 35.9* 32.2*  MCV 89.9 89.7 90.2 89.4  PLT 253 223 289 306   Basic Metabolic Panel:  Recent Labs Lab 04/16/17 1628 04/17/17 0545 04/17/17 1256 04/18/17 0539 04/19/17 0525  NA 138 140  --  138 137  K 3.5 3.2*  --  3.9 4.0  CL 104 107  --  103 107  CO2 27 24  --  25 21*  GLUCOSE 155* 93  --  96 157*  BUN 37* 36*  --  42* 44*  CREATININE 1.90* 2.26*  --  2.68* 2.71*  CALCIUM 7.5* 7.0*  --  7.2* 6.7*  MG  --   --  1.3* 1.7 1.7   GFR: Estimated Creatinine Clearance: 28.5 mL/min (A) (by C-G formula based on SCr of 2.71 mg/dL (H)). Liver Function Tests:  Recent Labs Lab 04/16/17 1628 04/17/17 0545  AST 20 12*  ALT 18 12*  ALKPHOS 109 83  BILITOT 0.9 0.9  PROT 5.1* 4.1*  ALBUMIN 1.1* <1.0*   No results for input(s): LIPASE, AMYLASE in the last 168 hours. No results for input(s): AMMONIA in the last 168 hours. Coagulation Profile:  Recent Labs Lab 04/16/17 2245 04/17/17 0545 04/18/17 0539 04/19/17 0525  INR 2.75 2.55 2.08 2.42   Cardiac Enzymes: No results for input(s): CKTOTAL, CKMB, CKMBINDEX, TROPONINI in the last 168 hours. BNP (last 3 results) No results for input(s): PROBNP in the last 8760 hours. HbA1C: No results for input(s): HGBA1C in the last 72 hours. CBG:  Recent Labs Lab 04/18/17 1257 04/18/17 1658 04/18/17 2126  04/19/17 0735 04/19/17 1148  GLUCAP 84 207* 188* 131* 159*   Lipid Profile: No results for input(s): CHOL, HDL, LDLCALC, TRIG, CHOLHDL, LDLDIRECT in the last 72 hours. Thyroid Function Tests: No results for input(s): TSH, T4TOTAL, FREET4, T3FREE, THYROIDAB in the last 72 hours. Anemia Panel: No results for input(s): VITAMINB12, FOLATE, FERRITIN, TIBC, IRON, RETICCTPCT in the last 72 hours. Urine analysis:    Component Value Date/Time   COLORURINE YELLOW 04/16/2017 2330   APPEARANCEUR HAZY (A) 04/16/2017 2330   LABSPEC 1.023 04/16/2017 2330   PHURINE 9.0 (H) 04/16/2017 2330   GLUCOSEU 150 (A) 04/16/2017 2330   HGBUR NEGATIVE 04/16/2017 2330   BILIRUBINUR NEGATIVE 04/16/2017 2330   KETONESUR NEGATIVE 04/16/2017 2330   PROTEINUR >=300 (A) 04/16/2017 2330   NITRITE NEGATIVE 04/16/2017 2330   LEUKOCYTESUR NEGATIVE 04/16/2017 2330   Sepsis Labs: Invalid input(s): PROCALCITONIN, LACTICIDVEN  Recent Results (from the past 240 hour(s))  Culture, blood (Routine X 2) w Reflex to ID Panel     Status: None (Preliminary result)   Collection Time: 04/16/17  6:15 PM  Result Value Ref Range Status   Specimen Description BLOOD RIGHT ANTECUBITAL  Final   Special Requests   Final    Blood Culture adequate volume BOTTLES DRAWN AEROBIC AND ANAEROBIC   Culture   Final    NO GROWTH 2 DAYS Performed at [facility], [address], [place], [place] [address]    Report Status PENDING  Incomplete  Culture, blood (Routine X 2) w Reflex to ID Panel     Status: None (Preliminary result)   Collection Time: 04/16/17  6:15 PM  Result Value Ref Range Status   Specimen Description BLOOD LEFT ANTECUBITAL  Final   Special Requests   Final    BOTTLES DRAWN AEROBIC AND ANAEROBIC Blood Culture adequate volume   Culture   Final    NO GROWTH 2 DAYS Performed at [facility], [address], [place], [place] [address]    Report Status PENDING  Incomplete  Gastrointestinal Panel by PCR , Stool      Status: None   Collection Time: 04/17/17  1:11 PM  Result Value Ref Range Status   Campylobacter species NOT DETECTED NOT DETECTED Final   Plesimonas shigelloides NOT DETECTED NOT DETECTED Final   Salmonella species NOT DETECTED NOT DETECTED Final   Yersinia enterocolitica NOT DETECTED NOT DETECTED Final   Vibrio species NOT DETECTED NOT DETECTED Final   Vibrio cholerae NOT DETECTED NOT DETECTED Final   Enteroaggregative E coli (EAEC) NOT DETECTED NOT DETECTED Final   Enteropathogenic E coli (EPEC) NOT DETECTED NOT DETECTED Final   Enterotoxigenic E coli (ETEC) NOT DETECTED NOT DETECTED Final   Shiga like toxin producing E coli (STEC) NOT DETECTED NOT DETECTED Final   Shigella/Enteroinvasive E coli (EIEC) NOT DETECTED NOT DETECTED Final   Cryptosporidium NOT DETECTED NOT DETECTED Final   Cyclospora cayetanensis NOT DETECTED NOT DETECTED Final   Entamoeba histolytica NOT DETECTED NOT DETECTED Final   Giardia lamblia NOT DETECTED NOT DETECTED Final   Adenovirus F40/41 NOT DETECTED NOT DETECTED Final   Astrovirus NOT DETECTED NOT DETECTED Final   Norovirus GI/GII NOT DETECTED NOT DETECTED Final   Rotavirus A NOT DETECTED NOT DETECTED Final   Sapovirus (I, II, IV, and V) NOT DETECTED NOT DETECTED Final  Aerobic/Anaerobic Culture (surgical/deep wound)     Status: None (Preliminary result)   Collection Time: 04/18/17 12:11 PM  Result Value Ref Range Status   Specimen Description ABSCESS RIGHT AXILLA  Final   Special Requests NONE  Final   Gram Stain   Final    MODERATE WBC PRESENT,BOTH PMN AND MONONUCLEAR MODERATE GRAM POSITIVE COCCI    Culture   Final    MODERATE STAPHYLOCOCCUS AUREUS SUSCEPTIBILITIES TO FOLLOW    Report Status PENDING  Incomplete      Radiology Studies: No results found.   Scheduled Meds: . dicyclomine  10 mg Oral TID AC & HS  . FLUoxetine  10 mg Oral Daily  . insulin aspart  0-5 Units Subcutaneous QHS  . insulin aspart  0-9 Units Subcutaneous TID WC  .  predniSONE  10 mg Oral Q breakfast  . QUEtiapine  25 mg Oral QHS  . sodium chloride flush  3 mL Intravenous Q12H  . tacrolimus  1 mg Oral BID  . valACYclovir  1,000 mg Oral  Q24H  . warfarin  5 mg Oral q1800  . Warfarin - Pharmacist Dosing Inpatient   Does not apply q1800   Continuous Infusions: . sodium chloride 50 mL/hr at 04/18/17 1724  . vancomycin Stopped (04/18/17 1911)     Pamella Pert, MD, PhD [facility] Pager [phone]  If 7PM-7AM, please contact night-coverage [email or web address] Password [email or web address] 04/19/2017, 2:36 PM

## 2017-04-19 NOTE — Progress Notes (Signed)
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   Medical History: Past Medical History:  Diagnosis Date  . Diabetes mellitus without complication (HCC)   . Hypertension   . Renal disorder     [name] presents to the ED with right breast cellulitis, on warfarin PTA for hypercoagulable state.  INR remains therapeutic on home regimen.  Will continue to follow trend.  CBC good.   Warfarin PTA dosing: 5 mg daily   Goal of Therapy:  INR 2-3 Monitor platelets by anticoagulation protocol: Yes   Plan:  Warfarin 5 mg daily at 1800 Daily PT/INR Monitor for bleeding   Toys 'R' UsKimberly Harshil Cavallaro, Pharm.D., BCPS Clinical Pharmacist Pager: [phone] Clinical phone for 04/19/2017 from 8:30-4:00 is [phone]. After 4pm, please call Main Rx [phone]) for assistance. 04/19/2017 2:51 PM

## 2017-04-20 DIAGNOSIS — N049 Nephrotic syndrome with unspecified morphologic changes: Secondary | ICD-10-CM

## 2017-04-20 LAB — BASIC METABOLIC PANEL
Anion gap: 8 (ref 5–15)
BUN: 48 mg/dL — AB (ref 6–20)
CO2: 23 mmol/L (ref 22–32)
Calcium: 6.7 mg/dL — ABNORMAL LOW (ref 8.9–10.3)
Chloride: 106 mmol/L (ref 101–111)
Creatinine, Ser: 2.74 mg/dL — ABNORMAL HIGH (ref 0.44–1.00)
GFR calc Af Amer: 22 mL/min — ABNORMAL LOW (ref >60)
GFR, EST NON AFRICAN AMERICAN: 19 mL/min — AB (ref >60)
GLUCOSE: 102 mg/dL — AB (ref 65–99)
POTASSIUM: 3.6 mmol/L (ref 3.5–5.1)
Sodium: 137 mmol/L (ref 135–145)

## 2017-04-20 LAB — CBC
HEMATOCRIT: 33.1 % — AB (ref 36.0–46.0)
Hemoglobin: 11 g/dL — ABNORMAL LOW (ref 12.0–15.0)
MCH: 29.9 pg (ref 26.0–34.0)
MCHC: 33.2 g/dL (ref 30.0–36.0)
MCV: 89.9 fL (ref 78.0–100.0)
PLATELETS: 297 10*3/uL (ref 150–400)
RBC: 3.68 MIL/uL — AB (ref 3.87–5.11)
RDW: 16.8 % — AB (ref 11.5–15.5)
WBC: 9.5 10*3/uL (ref 4.0–10.5)

## 2017-04-20 LAB — GLUCOSE, CAPILLARY
GLUCOSE-CAPILLARY: 101 mg/dL — AB (ref 65–99)
GLUCOSE-CAPILLARY: 131 mg/dL — AB (ref 65–99)
GLUCOSE-CAPILLARY: 160 mg/dL — AB (ref 65–99)
GLUCOSE-CAPILLARY: 116 mg/dL — AB (ref 65–99)

## 2017-04-20 LAB — MAGNESIUM: Magnesium: 1.8 mg/dL (ref 1.7–2.4)

## 2017-04-20 LAB — PROTIME-INR
INR: 3.14
Prothrombin Time: 32 seconds — ABNORMAL HIGH (ref 11.4–15.2)

## 2017-04-20 LAB — TACROLIMUS LEVEL: Tacrolimus (FK506) - LabCorp: 7.4 ng/mL (ref 2.0–20.0)

## 2017-04-20 LAB — VANCOMYCIN, TROUGH: VANCOMYCIN TR: 28 ug/mL — AB (ref 15–20)

## 2017-04-20 MED ORDER — SODIUM CHLORIDE 0.9 % IV SOLN
500.0000 mg | INTRAVENOUS | Status: DC
  Administered 2017-04-21: 500 mg via INTRAVENOUS
  Filled 2017-04-20 (×2): qty 500

## 2017-04-20 NOTE — Progress Notes (Signed)
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   Medical History: Past Medical History:  Diagnosis Date  . Diabetes mellitus without complication (HCC)   . Hypertension   . Renal disorder     [name] presents to the ED with right breast cellulitis, on warfarin PTA for hypercoagulable state.  INR is trending up quickly on home regimen and is now supratherapeutic.  Will hold dose tonight.   Warfarin PTA dosing: 5 mg daily   Goal of Therapy:  INR 2-3 Monitor platelets by anticoagulation protocol: Yes   Plan:  No Warfarin tonight. Daily PT/INR Monitor for bleeding   Toys 'R' UsKimberly Natashia Roseman, Pharm.D., BCPS Clinical Pharmacist Pager: [phone] Clinical phone for 04/20/2017 from 8:30-4:00 is [phone]. After 4pm, please call Main Rx [phone]) for assistance. 04/20/2017 4:02 PM

## 2017-04-20 NOTE — Progress Notes (Signed)
Pharmacy Antibiotic Note  Gail King is a 51 y.o. female admitted on 04/16/2017 with cellulitis. Pharmacy managing vancomycin for cellulitis. 23-hr vancomycin trough was supratherapeutic at 28 today. WBC down to 9.5. SCr has increased to 2.74 with minimal urine output.   Plan: -Hold vancomycin dose today -Decrease vancomycin to 500 mg IV Q 24 hours. Next dose to be scheduled tomorrow at 1800. Vanc trough goal 10-15 mcg/mL -Monitor CBC, renal fx, cultures and clinical progress -Re-check VT as necessary   Height: 5\' 4"  (162.6 cm) Weight: 228 lb 12.8 oz (103.8 kg) IBW/kg (Calculated) : 54.7  Temp (24hrs), Avg:98.2 F (36.8 C), Min:97.7 F (36.5 C), Max:98.8 F (37.1 C)   Recent Labs Lab 04/16/17 1628 04/16/17 1651 04/17/17 0545 04/18/17 0539 04/19/17 0525 04/20/17 0645 04/20/17 1811  WBC 12.8*  --  9.9 13.3* 11.3* 9.5  --   CREATININE 1.90*  --  2.26* 2.68* 2.71* 2.74*  --   LATICACIDVEN  --  0.97  --   --   --   --   --   VANCOTROUGH  --   --   --   --   --   --  28*    Estimated Creatinine Clearance: 28.2 mL/min (A) (by C-G formula based on SCr of 2.74 mg/dL (H)).    No Known Allergies  Antimicrobials this admission: Vancomycin 10/28 >>  Valacyclovir 10/29 >>   Dose adjustments this admission: 11/1: 23-hr VT 28 on 1 gm Q 24h > Vanc 500 mg IV Q 24 hours  Microbiology results: 10/30 Tissue: MRSA  10/28 BCx: ngtd   Thank you for allowing pharmacy to be a part of this patient's care.  Vinnie LevelBenjamin Rayetta Veith, PharmD., BCPS Clinical Pharmacist Pager [phone]

## 2017-04-20 NOTE — Progress Notes (Signed)
[facility] Progress Note    Assessment/ Plan:   1.  Acute kidney injury superimposed on CKD: AKI likely hemodynamically mediated in setting of sepsis/ diuresis/ third spacing.  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  She has fairly severe nephrotic syndrome with her albumin in the 1s.  As such, empiric anticoagulation is appropriate.  The natural history of MN is variable- some individuals spontaneously remit, others require medication, and still others have an unrelenting course which ends in ESRD.  She was diagnosed < 6 months ago.  For now, appropriate to continue CNI and steroids.  If no improvement, would consider cyclophosphamide as an outpatient.  Would not re-pulse with steroids given infection.  Agree with holding ARB.  tac trough pending Due to increasing Cr have decreased tac to 1 mg BID.  Stopping fluids.  2.  Anasarca: largely unchanged.  Restart diuretics when appropriate.  3.  Breast cellulitis with abscess/ ? shingles: on vanc, acyclovir stopped and transitioned to valtrex. S/p I and d with gen surg, dressings changed today.  Cultures growing MRSA.  Would avoid Bactrim.  Subjective:    MRSA in cultures from breast I and D   Objective:   BP 130/78 (BP Location: Left Arm)   Pulse 77   Temp 98.5 F (36.9 C) (Oral)   Resp 18   Ht 5\' 4"  (1.626 m)   Wt 103.8 kg (228 lb 12.8 oz)   SpO2 95%   BMI 39.27 kg/m   Intake/Output Summary (Last 24 hours) at 04/20/17 1314 Last data filed at 04/20/17 0543  Gross per 24 hour  Intake             1394 ml  Output                0 ml  Net             1394 ml   Weight change:   Physical Exam: GEN NAD, looks like she feels much better, standing and walking arouns HEENT EOMI, PERRL, MMM NECK no JVD BREAST: R breast dressed, dressings c/d/i PULM clear bilaterally, able to lay flat without difficulty CV RRR no m/r/g ABD NABS EXT 1+ upper and LE edema, a little abd wall fullness. NEURO AAO x  3  Imaging: No results found.  Labs: BMET  Recent Labs Lab 04/16/17 1628 04/17/17 0545 04/18/17 0539 04/19/17 0525 04/20/17 0645  NA 138 140 138 137 137  K 3.5 3.2* 3.9 4.0 3.6  CL 104 107 103 107 106  CO2 27 24 25  21* 23  GLUCOSE 155* 93 96 157* 102*  BUN 37* 36* 42* 44* 48*  CREATININE 1.90* 2.26* 2.68* 2.71* 2.74*  CALCIUM 7.5* 7.0* 7.2* 6.7* 6.7*   CBC  Recent Labs Lab 04/16/17 1628 04/17/17 0545 04/18/17 0539 04/19/17 0525 04/20/17 0645  WBC 12.8* 9.9 13.3* 11.3* 9.5  NEUTROABS 10.4* 7.7  --   --   --   HGB 13.3 11.3* 12.0 10.6* 11.0*  HCT 40.1 34.0* 35.9* 32.2* 33.1*  MCV 89.9 89.7 90.2 89.4 89.9  PLT 253 223 289 306 297    Medications:    . dicyclomine  10 mg Oral TID AC & HS  . FLUoxetine  10 mg Oral Daily  . insulin aspart  0-5 Units Subcutaneous QHS  . insulin aspart  0-9 Units Subcutaneous TID WC  . predniSONE  10 mg Oral Q breakfast  . QUEtiapine  25 mg Oral QHS  . sodium chloride  flush  3 mL Intravenous Q12H  . tacrolimus  1 mg Oral BID  . valACYclovir  1,000 mg Oral Q24H  . warfarin  5 mg Oral q1800  . Warfarin - Pharmacist Dosing Inpatient   Does not apply q1800      Bufford Buttner, MD [facility] pgr [phone] 04/20/2017, 1:14 PM

## 2017-04-20 NOTE — Progress Notes (Signed)
[facility] Progress Note  2 Days Post-Op  Subjective: CC: Right axillary pressure completely resolved. Pain improving. Denies fever/chills. Reports dressing was changed around 4:00 AM.  Afebrile, VSS, leukocytosis resolved Creatinine 2.68 today (from 2.26, 1.9)  Objective: Vital signs in last 24 hours: Temp:  [97.7 F (36.5 C)-98.5 F (36.9 C)] 98.5 F (36.9 C) (11/01 0554) Pulse Rate:  [77-95] 77 (11/01 0554) Resp:  [18-20] 18 (11/01 0554) BP: (124-178)/(78-95) 130/78 (11/01 0554) SpO2:  [95 %-99 %] 95 % (11/01 0554) Last BM Date: 04/19/17  Intake/Output from previous day: 10/31 0701 - 11/01 0700 In: 1394 [I.V.:1394] Out: 200 [Urine:200] Intake/Output this shift: No intake/output data recorded.  PE: Gen:  Alert, NAD, cooperative  Pulm: normal respiratory effort  Skin/Right axillary wound: there is a 4-5 cm proximal incision between axilla and upper-outer quadrant right breast with a 3-4 cm counter incision inferiorly around anterior axillary line. cellulitis surrounding the incisions improved significantly. No residual fluctuance. Appropriately tender.  Lab Results:   Recent Labs  04/19/17 0525 04/20/17 0645  WBC 11.3* 9.5  HGB 10.6* 11.0*  HCT 32.2* 33.1*  PLT 306 297   BMET  Recent Labs  04/18/17 0539 04/19/17 0525  NA 138 137  K 3.9 4.0  CL 103 107  CO2 25 21*  GLUCOSE 96 157*  BUN 42* 44*  CREATININE 2.68* 2.71*  CALCIUM 7.2* 6.7*   PT/INR  Recent Labs  04/18/17 0539 04/19/17 0525  LABPROT 23.2* 26.1*  INR 2.08 2.42   CMP     Component Value Date/Time   NA 137 04/19/2017 0525   K 4.0 04/19/2017 0525   CL 107 04/19/2017 0525   CO2 21 (L) 04/19/2017 0525   GLUCOSE 157 (H) 04/19/2017 0525   BUN 44 (H) 04/19/2017 0525   CREATININE 2.71 (H) 04/19/2017 0525   CALCIUM 6.7 (L) 04/19/2017 0525   PROT 4.1 (L) 04/17/2017 0545   ALBUMIN <1.0 (L) 04/17/2017 0545   AST 12 (L) 04/17/2017 0545   ALT 12 (L) 04/17/2017 0545   ALKPHOS 83 04/17/2017 0545   BILITOT 0.9 04/17/2017 0545   GFRNONAA 19 (L) 04/19/2017 0525   GFRAA 22 (L) 04/19/2017 0525   Lipase  No results found for: LIPASE     Studies/Results: No results found.  Anti-infectives: Anti-infectives    Start     Dose/Rate Route Frequency Ordered Stop   04/18/17 1800  valACYclovir (VALTREX) tablet 1,000 mg     1,000 mg Oral Every 24 hours 04/18/17 1703     04/17/17 1900  vancomycin (VANCOCIN) IVPB 1000 mg/200 mL premix     1,000 mg 200 mL/hr over 60 Minutes Intravenous Every 24 hours 04/16/17 2211     04/17/17 1600  acyclovir (ZOVIRAX) 200 MG capsule 800 mg  Status:  Discontinued     800 mg Oral 5 times daily 04/17/17 1504 04/17/17 1523   04/17/17 1600  acyclovir (ZOVIRAX) tablet 800 mg  Status:  Discontinued     800 mg Oral 5 times daily 04/17/17 1523 04/18/17 1703   04/16/17 1830  vancomycin (VANCOCIN) IVPB 1000 mg/200 mL premix     1,000 mg 200 mL/hr over 60 Minutes Intravenous  Once 04/16/17 1819 04/16/17 1932     Assessment/Plan AKI w/ CKD, nephrotic syndrome - creatinine increasing; diuretics held; hopefully CX sensitivities will return today and can d/c vanc. Appreciate renal following.  Right breast cellulitis/abscess S/p I&D right axillary abscess 10/30 Dr. Abigail Miyamotoouglas Blackman              -  afebrile, VSS, WBC 11.3 from 13.3             - GS growing staph aurius; continue IV abx for now and await sensitivities.             - BID wet-to-dry dressing changes by nursing staff starting 11/1; patient should be pre-medicated with oral pain meds 30-40 minutes before dressing changes and 0.5-1.0 mg dilaudid 5 mins before. Hopefully we can wean IV pain meds over next 24-48h.             - PRN pain control   FEN: carb mod diet  ID: Vancomycin 10/28 >> (day#5) , acyclovir 10/29 >> (ok to discontinue antiviral tx from surgical perspective); leukocytosis resolved.  VTE: SCD's, warfarin per pharmacy for nephrotic syndrome  Foley: none  Follow  up: Dr. Abigail Miyamoto    LOS: 4 days    Adam Phenix , [facility] 04/20/2017, 8:00 AM Pager: [phone] Consults: [phone] Mon-Fri 7:00 am-4:30 pm Sat-Sun 7:00 am-11:30 am

## 2017-04-20 NOTE — Progress Notes (Signed)
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 Patient had been admitted to [facility] from 03/28/2017 until 04/13/2017 for IV diuresis as she had developed anasarca, likely secondary to nephropathy with serum albumin of 1.1. She underwent IV diuresis and had a mild increase in her creatinine at that time. She had profuse watery diarrhea with negative C. difficile testing, attributed to cyclosporine and magnesium oxide. Cyclosporine was changed to Prograf and her diuretics were adjusted in light of the worsening renal function. She was discharged in much improved and stable condition, but has since developed fevers and chills with purulent drainage from the upper right breast. She is status post I&D by general surgery on 10/30  Assessment & Plan: Principal Problem:   Cellulitis of right breast Active Problems:   Hypoalbuminemia   Watery diarrhea   AKI (acute kidney injury) (HCC)   Membranous nephrosis   Acute kidney injury (HCC)   Cellulitis of breast   Sepsis secondary to cellulitis right breast -General surgery following -Blood culture pending  -Continue empiric vanco  -?Shingles, start empiric anti-virals as patient immunosuppressed  -S/p I&D 10/30, wound culture with MRSA which is sensitive to tetracyclines and Bactrim, resistant to clindamycin  Nephrotic syndrome/membranous GN  -Diagnosed by biopsy in 01/2017. Recently admitted at [facility] (10/9-10/25) for anasarca, diuresed with IV lasix, switched to torsemide 20mg  BID, aldactone 12.5mg  daily -Switched  from cyclosporine, prednisone to prograf and prednisone  -Coumadin for hypercoagulable state, per pharmacy  -Holding cozaar and diuretics for now due to worsening kidney function  -Cr on discharge from [facility] was 1.5. Now 1.9 --> 2.26 --> 2.68 --> 2.71 -Weight at [facility] on 10/25 was 232lb.  -Consult Nephrology due to worsening kidney function , appreciate input  Diarrhea -Previously thought to be secondary to cyclosporine, magnesium oxide use. Has hx of IBS-D.  -GI pathogen panel negative, C. difficile pending -Diarrhea is resolved today  DM type 2 -Hold glucotrol -SSI   Major depression, recurrent/panic disorder/insomnia -Continue prozac, seroquel    DVT prophylaxis: Coumadin Code Status: Full code Family Communication: no family bedside Disposition Plan: Home in 1-2 days when cleared by nephrology and is able to have dressing changes on oral pain medications alone  Consultants:   Nephrology   General surgery   Procedures:   I&D 10/30  Antimicrobials:  Vancomycin 10/28 >> (will transition to doxycycline on discharge)  Subjective: -Feeling pretty good this morning, she is happy that her diarrhea has stopped.  Objective: Vitals:   04/19/17 1604 04/19/17 2200 04/19/17 2209 04/20/17 0554  BP: (!) 124/92 (!) 178/95 (!) 158/88 130/78  Pulse: 95 87 87 77  Resp: 20 18  18   Temp: 98.2 F (36.8 C) 97.7 F (36.5 C) 97.7 F (36.5 C) 98.5 F (36.9 C)  TempSrc: Oral Oral Oral Oral  SpO2: 97% 99% 99% 95%  Weight:      Height:        Intake/Output Summary (Last 24 hours) at 04/20/17 1252 Last data filed at 04/20/17 0543  Gross per 24 hour  Intake             1394 ml  Output                0 ml  Net             1394 ml   Filed Weights   04/16/17 1625 04/16/17 2137 04/18/17 0559  Weight: 105.2 kg (232 lb) 103.8 kg (228 lb 14.4 oz) 103.8 kg (228 lb 12.8 oz)    Examination:  Constitutional: NAD, calm, comfortable Eyes: lids and conjunctivae  normal ENMT: Mucous membranes are moist.  Neck: normal, supple Respiratory: clear to auscultation bilaterally, no wheezing, no crackles. Normal respiratory effort.  Cardiovascular: Regular rate and rhythm, no murmurs / rubs / gallops. No LE edema. 2+ pedal pulses.  Abdomen: no tenderness. Bowel sounds positive.    Data Reviewed: I have independently reviewed following labs and imaging studies   CBC:  Recent Labs Lab 04/16/17 1628 04/17/17 0545 04/18/17 0539 04/19/17 0525 04/20/17 0645  WBC 12.8* 9.9 13.3* 11.3* 9.5  NEUTROABS 10.4* 7.7  --   --   --   HGB 13.3 11.3* 12.0 10.6* 11.0*  HCT 40.1 34.0* 35.9* 32.2* 33.1*  MCV 89.9 89.7 90.2 89.4 89.9  PLT 253 223 289 306 297   Basic Metabolic Panel:  Recent Labs Lab 04/16/17 1628 04/17/17 0545 04/17/17 1256 04/18/17 0539 04/19/17 0525 04/20/17 0645  NA 138 140  --  138 137 137  K 3.5 3.2*  --  3.9 4.0 3.6  CL 104 107  --  103 107 106  CO2 27 24  --  25 21* 23  GLUCOSE 155* 93  --  96 157* 102*  BUN 37* 36*  --  42* 44* 48*  CREATININE 1.90* 2.26*  --  2.68* 2.71* 2.74*  CALCIUM 7.5* 7.0*  --  7.2* 6.7* 6.7*  MG  --   --  1.3* 1.7 1.7 1.8   GFR: Estimated Creatinine Clearance: 28.2 mL/min (A) (by C-G formula based on SCr of 2.74 mg/dL (H)). Liver Function Tests:  Recent Labs Lab 04/16/17 1628 04/17/17 0545  AST 20 12*  ALT 18 12*  ALKPHOS 109 83  BILITOT 0.9 0.9  PROT 5.1* 4.1*  ALBUMIN 1.1* <1.0*   No results for input(s): LIPASE, AMYLASE in the last 168 hours. No results for input(s): AMMONIA in the last 168 hours. Coagulation Profile:  Recent Labs Lab 04/16/17 2245 04/17/17 0545 04/18/17 0539 04/19/17 0525 04/20/17 0645  INR 2.75 2.55 2.08 2.42 3.14   Cardiac Enzymes: No results for input(s): CKTOTAL, CKMB, CKMBINDEX, TROPONINI in the last 168 hours. BNP (last 3 results) No results for input(s): PROBNP in the last 8760 hours. HbA1C: No results for input(s): HGBA1C in the last 72  hours. CBG:  Recent Labs Lab 04/19/17 1148 04/19/17 1716 04/19/17 2205 04/20/17 0803 04/20/17 1216  GLUCAP 159* 165* 140* 101* 131*   Lipid Profile: No results for input(s): CHOL, HDL, LDLCALC, TRIG, CHOLHDL, LDLDIRECT in the last 72 hours. Thyroid Function Tests: No results for input(s): TSH, T4TOTAL, FREET4, T3FREE, THYROIDAB in the last 72 hours. Anemia Panel: No results for input(s): VITAMINB12, FOLATE, FERRITIN, TIBC, IRON, RETICCTPCT in the last 72 hours. Urine analysis:    Component Value Date/Time   COLORURINE YELLOW 04/16/2017 2330   APPEARANCEUR HAZY (A) 04/16/2017 2330   LABSPEC 1.023 04/16/2017 2330   PHURINE 9.0 (H) 04/16/2017 2330   GLUCOSEU 150 (A) 04/16/2017 2330   HGBUR NEGATIVE 04/16/2017 2330   BILIRUBINUR NEGATIVE 04/16/2017 2330   KETONESUR NEGATIVE 04/16/2017 2330   PROTEINUR >=300 (A) 04/16/2017  2330   NITRITE NEGATIVE 04/16/2017 2330   LEUKOCYTESUR NEGATIVE 04/16/2017 2330   Sepsis Labs: Invalid input(s): PROCALCITONIN, LACTICIDVEN  Recent Results (from the past 240 hour(s))  Culture, blood (Routine X 2) w Reflex to ID Panel     Status: None (Preliminary result)   Collection Time: 04/16/17  6:15 PM  Result Value Ref Range Status   Specimen Description BLOOD RIGHT ANTECUBITAL  Final   Special Requests   Final    Blood Culture adequate volume BOTTLES DRAWN AEROBIC AND ANAEROBIC   Culture   Final    NO GROWTH 2 DAYS Performed at [facility], [address], [place], [place] [address]    Report Status PENDING  Incomplete  Culture, blood (Routine X 2) w Reflex to ID Panel     Status: None (Preliminary result)   Collection Time: 04/16/17  6:15 PM  Result Value Ref Range Status   Specimen Description BLOOD LEFT ANTECUBITAL  Final   Special Requests   Final    BOTTLES DRAWN AEROBIC AND ANAEROBIC Blood Culture adequate volume   Culture   Final    NO GROWTH 2 DAYS Performed at [facility], [address] [place], [place] [address]     Report Status PENDING  Incomplete  Gastrointestinal Panel by PCR , Stool     Status: None   Collection Time: 04/17/17  1:11 PM  Result Value Ref Range Status   Campylobacter species NOT DETECTED NOT DETECTED Final   Plesimonas shigelloides NOT DETECTED NOT DETECTED Final   Salmonella species NOT DETECTED NOT DETECTED Final   Yersinia enterocolitica NOT DETECTED NOT DETECTED Final   Vibrio species NOT DETECTED NOT DETECTED Final   Vibrio cholerae NOT DETECTED NOT DETECTED Final   Enteroaggregative E coli (EAEC) NOT DETECTED NOT DETECTED Final   Enteropathogenic E coli (EPEC) NOT DETECTED NOT DETECTED Final   Enterotoxigenic E coli (ETEC) NOT DETECTED NOT DETECTED Final   Shiga like toxin producing E coli (STEC) NOT DETECTED NOT DETECTED Final   Shigella/Enteroinvasive E coli (EIEC) NOT DETECTED NOT DETECTED Final   Cryptosporidium NOT DETECTED NOT DETECTED Final   Cyclospora cayetanensis NOT DETECTED NOT DETECTED Final   Entamoeba histolytica NOT DETECTED NOT DETECTED Final   Giardia lamblia NOT DETECTED NOT DETECTED Final   Adenovirus F40/41 NOT DETECTED NOT DETECTED Final   Astrovirus NOT DETECTED NOT DETECTED Final   Norovirus GI/GII NOT DETECTED NOT DETECTED Final   Rotavirus A NOT DETECTED NOT DETECTED Final   Sapovirus (I, II, IV, and V) NOT DETECTED NOT DETECTED Final  Aerobic/Anaerobic Culture (surgical/deep wound)     Status: None (Preliminary result)   Collection Time: 04/18/17 12:11 PM  Result Value Ref Range Status   Specimen Description ABSCESS RIGHT AXILLA  Final   Special Requests NONE  Final   Gram Stain   Final    MODERATE WBC PRESENT,BOTH PMN AND MONONUCLEAR MODERATE GRAM POSITIVE COCCI    Culture   Final    MODERATE METHICILLIN RESISTANT STAPHYLOCOCCUS AUREUS NO ANAEROBES ISOLATED; CULTURE IN PROGRESS FOR 5 DAYS    Report Status PENDING  Incomplete   Organism ID, Bacteria METHICILLIN RESISTANT STAPHYLOCOCCUS AUREUS  Final      Susceptibility   Methicillin  resistant staphylococcus aureus - MIC*    CIPROFLOXACIN >=8 RESISTANT Resistant     ERYTHROMYCIN >=8 RESISTANT Resistant     GENTAMICIN <=0.5 SENSITIVE Sensitive     OXACILLIN >=4 RESISTANT Resistant     TETRACYCLINE >=16 RESISTANT  Resistant     VANCOMYCIN <=0.5 SENSITIVE Sensitive     TRIMETH/SULFA <=10 SENSITIVE Sensitive     CLINDAMYCIN >=8 RESISTANT Resistant     RIFAMPIN <=0.5 SENSITIVE Sensitive     Inducible Clindamycin NEGATIVE Sensitive     * MODERATE METHICILLIN RESISTANT STAPHYLOCOCCUS AUREUS      Radiology Studies: No results found.   Scheduled Meds: . dicyclomine  10 mg Oral TID AC & HS  . FLUoxetine  10 mg Oral Daily  . insulin aspart  0-5 Units Subcutaneous QHS  . insulin aspart  0-9 Units Subcutaneous TID WC  . predniSONE  10 mg Oral Q breakfast  . QUEtiapine  25 mg Oral QHS  . sodium chloride flush  3 mL Intravenous Q12H  . tacrolimus  1 mg Oral BID  . valACYclovir  1,000 mg Oral Q24H  . warfarin  5 mg Oral q1800  . Warfarin - Pharmacist Dosing Inpatient   Does not apply q1800   Continuous Infusions: . sodium chloride 50 mL/hr at 04/18/17 1724  . vancomycin Stopped (04/19/17 2008)     Pamella Pert, MD, PhD [facility] Pager [phone]  If 7PM-7AM, please contact night-coverage [email or web address] Password [email or web address] 04/20/2017, 12:52 PM

## 2017-04-20 NOTE — Progress Notes (Signed)
CRITICAL VALUE STICKER  CRITICAL VALUE: vanco trough 28  RECEIVER (on-site recipient of call): Zettie CooleyMegan S., RN  DATE & TIME NOTIFIED:  04/20/17 @ 1811  MESSENGER (representative from lab): Charleston PootW. Bond  MD NOTIFIED: Pharmacy notified  TIME OF NOTIFICATION: 1937  RESPONSE: Pharmacy D/C'd old dose, ordering new dose.

## 2017-04-21 LAB — CBC
HEMATOCRIT: 33.1 % — AB (ref 36.0–46.0)
HEMOGLOBIN: 10.8 g/dL — AB (ref 12.0–15.0)
MCH: 29.3 pg (ref 26.0–34.0)
MCHC: 32.6 g/dL (ref 30.0–36.0)
MCV: 89.9 fL (ref 78.0–100.0)
Platelets: 317 10*3/uL (ref 150–400)
RBC: 3.68 MIL/uL — ABNORMAL LOW (ref 3.87–5.11)
RDW: 17.1 % — ABNORMAL HIGH (ref 11.5–15.5)
WBC: 9.4 10*3/uL (ref 4.0–10.5)

## 2017-04-21 LAB — BASIC METABOLIC PANEL
Anion gap: 7 (ref 5–15)
BUN: 47 mg/dL — AB (ref 6–20)
CHLORIDE: 108 mmol/L (ref 101–111)
CO2: 22 mmol/L (ref 22–32)
CREATININE: 2.36 mg/dL — AB (ref 0.44–1.00)
Calcium: 6.7 mg/dL — ABNORMAL LOW (ref 8.9–10.3)
GFR calc Af Amer: 26 mL/min — ABNORMAL LOW (ref >60)
GFR calc non Af Amer: 23 mL/min — ABNORMAL LOW (ref >60)
GLUCOSE: 95 mg/dL (ref 65–99)
Potassium: 3.7 mmol/L (ref 3.5–5.1)
SODIUM: 137 mmol/L (ref 135–145)

## 2017-04-21 LAB — PROTIME-INR
INR: 2.82
PROTHROMBIN TIME: 29.4 s — AB (ref 11.4–15.2)

## 2017-04-21 LAB — GLUCOSE, CAPILLARY
GLUCOSE-CAPILLARY: 146 mg/dL — AB (ref 65–99)
GLUCOSE-CAPILLARY: 176 mg/dL — AB (ref 65–99)
Glucose-Capillary: 78 mg/dL (ref 65–99)
Glucose-Capillary: 156 mg/dL — ABNORMAL HIGH (ref 65–99)

## 2017-04-21 LAB — MAGNESIUM: Magnesium: 1.7 mg/dL (ref 1.7–2.4)

## 2017-04-21 MED ORDER — ACETAMINOPHEN 325 MG PO TABS
650.0000 mg | ORAL_TABLET | Freq: Two times a day (BID) | ORAL | Status: DC
  Administered 2017-04-21 (×2): 650 mg via ORAL
  Filled 2017-04-21 (×3): qty 2

## 2017-04-21 NOTE — Progress Notes (Addendum)
Pt with Medicaid of [place], zyvox tablet, /IV solution/ suspension is not on the preferred drug list for Medicaid recipients. Medication will need to be authorized by MD. [id number], member # [id number] Q. CM to make MD aware. Gae GallopAngela Melissa Pulido RN,BSN,CM

## 2017-04-21 NOTE — Care Management Note (Signed)
Case Management Note  Patient Details  Name: Gail King MRN: [id number] Date of Birth: 08/20/1965  Subjective/Objective:   Cellulitis of right breast. Resides with twin daughters, 51 yr old. Uses walker with ambulation.    s/p INCISION AND DRAINAGE RIGHT AXILLARY ABSCESS 10/30  PCP; Laverda SorensonAmie Adams NP   Action/Plan: Plan to d/c to home when medically stable. Pt to d/c with home health services (RN)to f/u with R breast wound/drsg.  MD home health nurse will need specifics for dressing changes.  Expected Discharge Date:    04/22/2017          Expected Discharge Plan:  Home/Self Care  In-House Referral:     Discharge planning Services  CM Consult  Post Acute Care Choice:    Choice offered to:     DME Arranged:    DME Agency:     HH Arranged:   RN HH Agency:   [facility], pending MD's order, CM has requested order from MD. [facility]  [facility] @ [phone]  Status of Service:  Completed, signed off  If discussed at Long Length of Stay Meetings, dates discussed:    Additional Comments:  Gail King, Gail Dorion Hudson, RN 04/21/2017, 4:51 PM

## 2017-04-21 NOTE — Discharge Instructions (Signed)
WOUND CARE: - dressing to be changed twice daily - supplies: sterile or normal saline, kerlix, scissors, ABD pads, tape  - remove dressing and all packing carefully, moistening with sterile saline as needed to avoid packing/internal dressing sticking to the wound. - clean edges of skin around the wound with water/gauze, making sure there is no tape debris or leakage left on skin that could cause skin irritation or breakdown. - dampen a clean kerlix/gauze with sterile saline and pack wound from wound base to skin level, making sure to take note of any possible areas of wound tracking, tunneling and packing appropriately. Wound can be packed loosely. Trim kerlix to size if a whole kerlix is not required. - cover wound with a dry ABD pad and secure with tape.  - you may write the date/time on the dry dressing/tape to better track when the last dressing change occurred. - apply any skin protectant/powder recommended by clinician to protect skin/skin folds. - change dressing as needed if leakage occurs, wound gets contaminated, or patient requests to shower. - patient may shower daily with wound open and following the shower the wound should be dried and a clean dressing placed.

## 2017-04-21 NOTE — Progress Notes (Signed)
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 Patient had been admitted to [facility] from 03/28/2017 until 04/13/2017 for IV diuresis as she had developed anasarca, likely secondary to nephropathy with serum albumin of 1.1. She underwent IV diuresis and had a mild increase in her creatinine at that time. She had profuse watery diarrhea with negative C. difficile testing, attributed to cyclosporine and magnesium oxide. Cyclosporine was changed to Prograf and her diuretics were adjusted in light of the worsening renal function. She was discharged in much improved and stable condition, but has since developed fevers and chills with purulent drainage from the upper right breast. She is status post I&D by general surgery on 10/30  Assessment & Plan: Principal Problem:   Cellulitis of right breast Active Problems:   Hypoalbuminemia   Watery diarrhea   AKI (acute kidney injury) (HCC)   Membranous nephrosis   Acute kidney injury (HCC)   Cellulitis of breast   Sepsis secondary to cellulitis right breast -General surgery following -Blood culture pending  -Continue empiric vanco  -?Shingles, start empiric anti-virals as patient immunosuppressed  -S/p I&D 10/30, wound culture with MRSA which is sensitive to Bactrim, otherwise resistant to doxycycline and clindamycin.  Can use Zyvox  Nephrotic syndrome/membranous GN  -Diagnosed by biopsy in 01/2017. Recently admitted at [facility] (10/9-10/25) for anasarca, diuresed with IV lasix, switched to torsemide 20mg  BID, aldactone  12.5mg  daily -Switched from cyclosporine, prednisone to prograf and prednisone  -Coumadin for hypercoagulable state, per pharmacy  -Holding cozaar and diuretics for now due to worsening kidney function  -Cr on discharge from [facility] was 1.5. Now 1.9 --> 2.26 --> 2.68 --> 2.71 -Weight at [facility] on 10/25 was 232lb.  -Consulted Nephrology due to worsening kidney function , appreciate input  Diarrhea -Previously thought to be secondary to cyclosporine, magnesium oxide use. Has hx of IBS-D.  -GI pathogen panel negative, C. difficile pending -Diarrhea has now resolved  DM type 2 -Hold glucotrol -SSI   Major depression, recurrent/panic disorder/insomnia -Continue prozac, seroquel    DVT prophylaxis: Coumadin Code Status: Full code Family Communication: no family bedside Disposition Plan: Home in 1-2 days when cleared by nephrology and is able to have dressing changes on oral pain medications alone  Consultants:   Nephrology   General surgery   Procedures:   I&D 10/30  Antimicrobials:  Vancomycin 10/28 >> (will transition to doxycycline on discharge)  Subjective: - no chest pain, shortness of breath, no abdominal pain, nausea or vomiting.   Objective: Vitals:   04/20/17 0554 04/20/17 1420 04/20/17 2220 04/21/17 0532  BP: 130/78 130/71 136/70 139/75  Pulse: 77 78 81 74  Resp: 18 18 18 18   Temp: 98.5 F (36.9 C) 98.8 F (37.1 C) 98.3 F (36.8 C) 98.3 F (36.8 C)  TempSrc: Oral Oral Oral Oral  SpO2: 95% 94% 97% 95%  Weight:      Height:        Intake/Output Summary (Last 24 hours) at 04/21/17 1247 Last data filed at 04/21/17 0725  Gross per 24 hour  Intake  0 ml  Output                2 ml  Net               -2 ml   Filed Weights   04/16/17 1625 04/16/17 2137 04/18/17 0559  Weight: 105.2 kg (232 lb) 103.8 kg (228 lb 14.4 oz) 103.8 kg (228 lb 12.8 oz)    Examination:  Constitutional: NAD, calm, comfortable Eyes: lids and  conjunctivae normal ENMT: Mucous membranes are moist.  Respiratory: clear to auscultation bilaterally, no wheezing, no crackles. Normal respiratory effort.  Cardiovascular: Regular rate and rhythm, no murmurs / rubs / gallops. 1+ LE edema. 2+ pedal pulses.  Abdomen: no tenderness. Bowel sounds positive.  Skin: no rashes, lesions, ulcers. No induration Neurologic: non focal    Data Reviewed: I have independently reviewed following labs and imaging studies   CBC:  Recent Labs Lab 04/16/17 1628 04/17/17 0545 04/18/17 0539 04/19/17 0525 04/20/17 0645 04/21/17 0353  WBC 12.8* 9.9 13.3* 11.3* 9.5 9.4  NEUTROABS 10.4* 7.7  --   --   --   --   HGB 13.3 11.3* 12.0 10.6* 11.0* 10.8*  HCT 40.1 34.0* 35.9* 32.2* 33.1* 33.1*  MCV 89.9 89.7 90.2 89.4 89.9 89.9  PLT 253 223 289 306 297 317   Basic Metabolic Panel:  Recent Labs Lab 04/17/17 0545 04/17/17 1256 04/18/17 0539 04/19/17 0525 04/20/17 0645 04/21/17 0353  NA 140  --  138 137 137 137  K 3.2*  --  3.9 4.0 3.6 3.7  CL 107  --  103 107 106 108  CO2 24  --  25 21* 23 22  GLUCOSE 93  --  96 157* 102* 95  BUN 36*  --  42* 44* 48* 47*  CREATININE 2.26*  --  2.68* 2.71* 2.74* 2.36*  CALCIUM 7.0*  --  7.2* 6.7* 6.7* 6.7*  MG  --  1.3* 1.7 1.7 1.8 1.7   GFR: Estimated Creatinine Clearance: 32.7 mL/min (A) (by C-G formula based on SCr of 2.36 mg/dL (H)). Liver Function Tests:  Recent Labs Lab 04/16/17 1628 04/17/17 0545  AST 20 12*  ALT 18 12*  ALKPHOS 109 83  BILITOT 0.9 0.9  PROT 5.1* 4.1*  ALBUMIN 1.1* <1.0*   No results for input(s): LIPASE, AMYLASE in the last 168 hours. No results for input(s): AMMONIA in the last 168 hours. Coagulation Profile:  Recent Labs Lab 04/17/17 0545 04/18/17 0539 04/19/17 0525 04/20/17 0645 04/21/17 0353  INR 2.55 2.08 2.42 3.14 2.82   Cardiac Enzymes: No results for input(s): CKTOTAL, CKMB, CKMBINDEX, TROPONINI in the last 168 hours. BNP (last 3 results) No results for  input(s): PROBNP in the last 8760 hours. HbA1C: No results for input(s): HGBA1C in the last 72 hours. CBG:  Recent Labs Lab 04/20/17 1216 04/20/17 1650 04/20/17 2219 04/21/17 0751 04/21/17 1227  GLUCAP 131* 160* 116* 78 156*   Lipid Profile: No results for input(s): CHOL, HDL, LDLCALC, TRIG, CHOLHDL, LDLDIRECT in the last 72 hours. Thyroid Function Tests: No results for input(s): TSH, T4TOTAL, FREET4, T3FREE, THYROIDAB in the last 72 hours. Anemia Panel: No results for input(s): VITAMINB12, FOLATE, FERRITIN, TIBC, IRON, RETICCTPCT in the last 72 hours. Urine analysis:    Component Value Date/Time   COLORURINE YELLOW 04/16/2017 2330   APPEARANCEUR HAZY (A) 04/16/2017 2330   LABSPEC 1.023 04/16/2017 2330   PHURINE 9.0 (H) 04/16/2017 2330   GLUCOSEU 150 (A) 04/16/2017 2330   HGBUR  NEGATIVE 04/16/2017 2330   BILIRUBINUR NEGATIVE 04/16/2017 2330   KETONESUR NEGATIVE 04/16/2017 2330   PROTEINUR >=300 (A) 04/16/2017 2330   NITRITE NEGATIVE 04/16/2017 2330   LEUKOCYTESUR NEGATIVE 04/16/2017 2330   Sepsis Labs: Invalid input(s): PROCALCITONIN, LACTICIDVEN  Recent Results (from the past 240 hour(s))  Culture, blood (Routine X 2) w Reflex to ID Panel     Status: None (Preliminary result)   Collection Time: 04/16/17  6:15 PM  Result Value Ref Range Status   Specimen Description BLOOD RIGHT ANTECUBITAL  Final   Special Requests   Final    Blood Culture adequate volume BOTTLES DRAWN AEROBIC AND ANAEROBIC   Culture   Final    NO GROWTH 3 DAYS Performed at [facility], [address], [place], [place] [address]    Report Status PENDING  Incomplete  Culture, blood (Routine X 2) w Reflex to ID Panel     Status: None (Preliminary result)   Collection Time: 04/16/17  6:15 PM  Result Value Ref Range Status   Specimen Description BLOOD LEFT ANTECUBITAL  Final   Special Requests   Final    BOTTLES DRAWN AEROBIC AND ANAEROBIC Blood Culture adequate volume   Culture   Final     NO GROWTH 3 DAYS Performed at [facility], [address], [place], [place] [address]    Report Status PENDING  Incomplete  Gastrointestinal Panel by PCR , Stool     Status: None   Collection Time: 04/17/17  1:11 PM  Result Value Ref Range Status   Campylobacter species NOT DETECTED NOT DETECTED Final   Plesimonas shigelloides NOT DETECTED NOT DETECTED Final   Salmonella species NOT DETECTED NOT DETECTED Final   Yersinia enterocolitica NOT DETECTED NOT DETECTED Final   Vibrio species NOT DETECTED NOT DETECTED Final   Vibrio cholerae NOT DETECTED NOT DETECTED Final   Enteroaggregative E coli (EAEC) NOT DETECTED NOT DETECTED Final   Enteropathogenic E coli (EPEC) NOT DETECTED NOT DETECTED Final   Enterotoxigenic E coli (ETEC) NOT DETECTED NOT DETECTED Final   Shiga like toxin producing E coli (STEC) NOT DETECTED NOT DETECTED Final   Shigella/Enteroinvasive E coli (EIEC) NOT DETECTED NOT DETECTED Final   Cryptosporidium NOT DETECTED NOT DETECTED Final   Cyclospora cayetanensis NOT DETECTED NOT DETECTED Final   Entamoeba histolytica NOT DETECTED NOT DETECTED Final   Giardia lamblia NOT DETECTED NOT DETECTED Final   Adenovirus F40/41 NOT DETECTED NOT DETECTED Final   Astrovirus NOT DETECTED NOT DETECTED Final   Norovirus GI/GII NOT DETECTED NOT DETECTED Final   Rotavirus A NOT DETECTED NOT DETECTED Final   Sapovirus (I, II, IV, and V) NOT DETECTED NOT DETECTED Final  Aerobic/Anaerobic Culture (surgical/deep wound)     Status: None (Preliminary result)   Collection Time: 04/18/17 12:11 PM  Result Value Ref Range Status   Specimen Description ABSCESS RIGHT AXILLA  Final   Special Requests NONE  Final   Gram Stain   Final    MODERATE WBC PRESENT,BOTH PMN AND MONONUCLEAR MODERATE GRAM POSITIVE COCCI    Culture   Final    MODERATE METHICILLIN RESISTANT STAPHYLOCOCCUS AUREUS NO ANAEROBES ISOLATED; CULTURE IN PROGRESS FOR 5 DAYS    Report Status PENDING  Incomplete   Organism ID,  Bacteria METHICILLIN RESISTANT STAPHYLOCOCCUS AUREUS  Final      Susceptibility   Methicillin resistant staphylococcus aureus - MIC*    CIPROFLOXACIN >=8 RESISTANT Resistant     ERYTHROMYCIN >=8 RESISTANT Resistant  GENTAMICIN <=0.5 SENSITIVE Sensitive     OXACILLIN >=4 RESISTANT Resistant     TETRACYCLINE >=16 RESISTANT Resistant     VANCOMYCIN <=0.5 SENSITIVE Sensitive     TRIMETH/SULFA <=10 SENSITIVE Sensitive     CLINDAMYCIN >=8 RESISTANT Resistant     RIFAMPIN <=0.5 SENSITIVE Sensitive     Inducible Clindamycin NEGATIVE Sensitive     * MODERATE METHICILLIN RESISTANT STAPHYLOCOCCUS AUREUS      Radiology Studies: No results found.   Scheduled Meds: . acetaminophen  650 mg Oral BID  . dicyclomine  10 mg Oral TID AC & HS  . FLUoxetine  10 mg Oral Daily  . insulin aspart  0-5 Units Subcutaneous QHS  . insulin aspart  0-9 Units Subcutaneous TID WC  . predniSONE  10 mg Oral Q breakfast  . QUEtiapine  25 mg Oral QHS  . sodium chloride flush  3 mL Intravenous Q12H  . tacrolimus  1 mg Oral BID  . valACYclovir  1,000 mg Oral Q24H  . Warfarin - Pharmacist Dosing Inpatient   Does not apply q1800   Continuous Infusions: . vancomycin       Pamella Pert, MD, PhD [facility] Pager [phone]  If 7PM-7AM, please contact night-coverage [email or web address] Password [facility] 04/21/2017, 12:47 PM

## 2017-04-21 NOTE — Progress Notes (Signed)
Taught both daughters how to complete dressing change to RT breast. Verbalized understanding and willingness to complete dressing changes as ordered. Handout given with step by step instructions.

## 2017-04-21 NOTE — Progress Notes (Signed)
[facility] Progress Note  3 Days Post-Op  Subjective: CC:  Right axillary pain/pressure improving daily. Received IV dilaudid for dressing change this AM around 0530. Today plans to premedicate with one hydrocodone and extra tylenol for pain control. Patient reports her daughter may be able to learn how to help her with dressing changes at home.   Objective: Vital signs in last 24 hours: Temp:  [98.3 F (36.8 C)-98.8 F (37.1 C)] 98.3 F (36.8 C) (11/02 0532) Pulse Rate:  [74-81] 74 (11/02 0532) Resp:  [18] 18 (11/02 0532) BP: (130-139)/(70-75) 139/75 (11/02 0532) SpO2:  [94 %-97 %] 95 % (11/02 0532) Last BM Date: 04/19/17  Intake/Output from previous day: No intake/output data recorded. Intake/Output this shift: No intake/output data recorded.  PE: Gen: Alert, NAD, cooperative  Pulm: normal respiratory effort  Skin/Right axillary wound:there is a 4-5 cm proximal incision between axilla and upper-outer quadrant right breast with a 3-4 cm counter incision inferiorly around anterior axillary line. cellulitis surrounding the incisions improved significantly, almost resolved. No residual fluctuance. Appropriately tender - tenderness improving.  Lab Results:   Recent Labs  04/20/17 0645 04/21/17 0353  WBC 9.5 9.4  HGB 11.0* 10.8*  HCT 33.1* 33.1*  PLT 297 317   BMET  Recent Labs  04/20/17 0645 04/21/17 0353  NA 137 137  K 3.6 3.7  CL 106 108  CO2 23 22  GLUCOSE 102* 95  BUN 48* 47*  CREATININE 2.74* 2.36*  CALCIUM 6.7* 6.7*   PT/INR  Recent Labs  04/20/17 0645 04/21/17 0353  LABPROT 32.0* 29.4*  INR 3.14 2.82   CMP     Component Value Date/Time   NA 137 04/21/2017 0353   K 3.7 04/21/2017 0353   CL 108 04/21/2017 0353   CO2 22 04/21/2017 0353   GLUCOSE 95 04/21/2017 0353   BUN 47 (H) 04/21/2017 0353   CREATININE 2.36 (H) 04/21/2017 0353   CALCIUM 6.7 (L) 04/21/2017 0353   PROT 4.1 (L) 04/17/2017 0545   ALBUMIN <1.0 (L) 04/17/2017  0545   AST 12 (L) 04/17/2017 0545   ALT 12 (L) 04/17/2017 0545   ALKPHOS 83 04/17/2017 0545   BILITOT 0.9 04/17/2017 0545   GFRNONAA 23 (L) 04/21/2017 0353   GFRAA 26 (L) 04/21/2017 0353   Anti-infectives: Anti-infectives    Start     Dose/Rate Route Frequency Ordered Stop   04/21/17 1800  vancomycin (VANCOCIN) 500 mg in sodium chloride 0.9 % 100 mL IVPB     500 mg 100 mL/hr over 60 Minutes Intravenous Every 24 hours 04/20/17 1939     04/18/17 1800  valACYclovir (VALTREX) tablet 1,000 mg     1,000 mg Oral Every 24 hours 04/18/17 1703     04/17/17 1900  vancomycin (VANCOCIN) IVPB 1000 mg/200 mL premix  Status:  Discontinued     1,000 mg 200 mL/hr over 60 Minutes Intravenous Every 24 hours 04/16/17 2211 04/20/17 1933   04/17/17 1600  acyclovir (ZOVIRAX) 200 MG capsule 800 mg  Status:  Discontinued     800 mg Oral 5 times daily 04/17/17 1504 04/17/17 1523   04/17/17 1600  acyclovir (ZOVIRAX) tablet 800 mg  Status:  Discontinued     800 mg Oral 5 times daily 04/17/17 1523 04/18/17 1703   04/16/17 1830  vancomycin (VANCOCIN) IVPB 1000 mg/200 mL premix     1,000 mg 200 mL/hr over 60 Minutes Intravenous  Once 04/16/17 1819 04/16/17 1932     Assessment/Plan Right breast cellulitis/abscess S/p  I&D right axillary abscess 10/30 Dr. Abigail Miyamotoouglas Blackman  - afebrile, VSS, leukocytosis resolved - GS growing staph aurius; transition to Zyvox  - BID wet-to-dry dressing changes with PO pain control (5-325 mg NORCO + 650 mg Tylenol) - PRN pain control   - HH RN for dressing changes, daughter to come in for wound care  FEN: carb mod diet  ID: Vancomycin 10/28-11/2, Zyvox  VTE: SCD's, warfarin per pharmacy for nephrotic syndrome  Foley: none  Follow up: Dr. Abigail Miyamotoouglas Blackman in 1-2 weeks    if tolerates dressing change with PO meds and has a plan for help with wound care [facility], patient may be discharged home from surgical  perspective. Attempt to change dressing BID for first week home and follow up in [facility] office as above.   LOS: 5 days    Adam PhenixElizabeth S Calem Cocozza , [facility] 04/21/2017, 7:21 AM Pager: [phone] Consults: [phone] Mon-Fri 7:00 am-4:30 pm Sat-Sun 7:00 am-11:30 am

## 2017-04-21 NOTE — Progress Notes (Signed)
[facility] Progress Note    Assessment/ Plan:   1.  Acute kidney injury superimposed on CKD, improving AKI likely hemodynamically mediated in setting of sepsis/ diuresis/ third spacing.  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  She has fairly severe nephrotic syndrome with her albumin in the 1s.  As such, empiric anticoagulation is appropriate. For now, appropriate to continue CNI and steroids.  If no improvement, would consider cyclophosphamide as an outpatient.  Would not re-pulse with steroids given infection.  Agree with holding ARB.  --Continue Prednisone 10 mg daily  --Continue Tacrolimus 1 mg bid po  --Continue holding Losartan --tac trough 7.4 (this was when she was taking 2 mg BID); would consider going back to 2 mg BID as outpatient with her nephrologist  2.  Anasarca, unchanged Creatinine and BUN down trending. Patient continue to have bilateral LE edema with mild basilar crackles. Would restart lasix an monitor UOP and volume status.  -Start  Lasix 40 mg po daily -Daily weights and I/O -Follow on BMP  3.  Breast cellulitis with abscess vs shingles, improving Currently on vancomycin and valtrex stopped. S/p I and d with gen surg. Seen by Surgery this am will transition from IV to po pain meds ( tylenol and hydrocodone).  Cultures growing MRSA.  Would avoid Bactrim. If tolerate po should be good for discharge. Intructions given for dressing changes.  Linezolid should be OK with tacrolimus.    Subjective:   Feels better.   Objective:   BP 139/75 (BP Location: Left Arm)   Pulse 74   Temp 98.3 F (36.8 C) (Oral)   Resp 18   Ht 5\' 4"  (1.626 m)   Wt 228 lb 12.8 oz (103.8 kg)   SpO2 95%   BMI 39.27 kg/m   Intake/Output Summary (Last 24 hours) at 04/21/17 1216 Last data filed at 04/21/17 0725  Gross per 24 hour  Intake                0 ml  Output                2 ml  Net               -2 ml   Weight change:   Physical  Exam: GEN NAD, looks like she feels much better, standing and walking arouns HEENT EOMI, PERRL, MMM NECK no JVD BREAST: R breast dressed, dressings c/d/i PULM clear bilaterally, able to lay flat without difficulty CV RRR no m/r/g ABD NABS EXT 1+ upper and LE edema, a little abd wall fullness. NEURO AAO x 3  Imaging: No results found.  Labs: BMET  Recent Labs Lab 04/16/17 1628 04/17/17 0545 04/18/17 0539 04/19/17 0525 04/20/17 0645 04/21/17 0353  NA 138 140 138 137 137 137  K 3.5 3.2* 3.9 4.0 3.6 3.7  CL 104 107 103 107 106 108  CO2 27 24 25  21* 23 22  GLUCOSE 155* 93 96 157* 102* 95  BUN 37* 36* 42* 44* 48* 47*  CREATININE 1.90* 2.26* 2.68* 2.71* 2.74* 2.36*  CALCIUM 7.5* 7.0* 7.2* 6.7* 6.7* 6.7*   CBC  Recent Labs Lab 04/16/17 1628 04/17/17 0545 04/18/17 0539 04/19/17 0525 04/20/17 0645 04/21/17 0353  WBC 12.8* 9.9 13.3* 11.3* 9.5 9.4  NEUTROABS 10.4* 7.7  --   --   --   --   HGB 13.3 11.3* 12.0 10.6* 11.0* 10.8*  HCT 40.1 34.0* 35.9* 32.2* 33.1* 33.1*  MCV 89.9 89.7  90.2 89.4 89.9 89.9  PLT 253 223 289 306 297 317    Medications:    . acetaminophen  650 mg Oral BID  . dicyclomine  10 mg Oral TID AC & HS  . FLUoxetine  10 mg Oral Daily  . insulin aspart  0-5 Units Subcutaneous QHS  . insulin aspart  0-9 Units Subcutaneous TID WC  . predniSONE  10 mg Oral Q breakfast  . QUEtiapine  25 mg Oral QHS  . sodium chloride flush  3 mL Intravenous Q12H  . tacrolimus  1 mg Oral BID  . valACYclovir  1,000 mg Oral Q24H  . Warfarin - Pharmacist Dosing Inpatient   Does not apply q1800     I have personally seen and examined this patient and agree with the assessment/plan as outlined.Marland Kitchen. Greta DoomUPTON, Corrina Steffensen,MD 04/21/2017 5:46 PM

## 2017-04-21 NOTE — Progress Notes (Signed)
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  No Known Allergies  Patient Measurements: Height: 5\' 4"  (162.6 cm) Weight: 228 lb 12.8 oz (103.8 kg) IBW/kg (Calculated) : 54.7 Vital Signs: Temp: 98.3 F (36.8 C) (11/02 0532) Temp Source: Oral (11/02 0532) BP: 139/75 (11/02 0532) Pulse Rate: 74 (11/02 0532)  Labs:  Recent Labs  04/19/17 0525 04/20/17 0645 04/21/17 0353  HGB 10.6* 11.0* 10.8*  HCT 32.2* 33.1* 33.1*  PLT 306 297 317  LABPROT 26.1* 32.0* 29.4*  INR 2.42 3.14 2.82  CREATININE 2.71* 2.74* 2.36*    Estimated Creatinine Clearance: 32.7 mL/min (A) (by C-G formula based on SCr of 2.36 mg/dL (H)).   Medical History: Past Medical History:  Diagnosis Date  . Diabetes mellitus without complication (HCC)   . Hypertension   . Renal disorder     [name] presents to the ED with right breast cellulitis, on warfarin PTA for hypercoagulable state.  INR trended up quickly on home regimen and is now therapeutic at 2.82 after holding dose last night. No bleeding noted, CBC is stable.  Warfarin PTA dosing: 5 mg daily   Goal of Therapy:  INR 2-3 Monitor platelets by anticoagulation protocol: Yes   Plan:  Warfarin 2.5 mg PO tonight Daily PT/INR Monitor for bleeding   Loura BackJennifer [place], PharmD, BCPS Clinical Pharmacist Phone for today [phone] Main pharmacy - [phone] 04/21/2017 12:36 PM

## 2017-04-22 LAB — MAGNESIUM: MAGNESIUM: 1.8 mg/dL (ref 1.7–2.4)

## 2017-04-22 LAB — CBC
HCT: 33.1 % — ABNORMAL LOW (ref 36.0–46.0)
HEMOGLOBIN: 11.4 g/dL — AB (ref 12.0–15.0)
MCH: 30.8 pg (ref 26.0–34.0)
MCHC: 34.4 g/dL (ref 30.0–36.0)
MCV: 89.5 fL (ref 78.0–100.0)
PLATELETS: 344 10*3/uL (ref 150–400)
RBC: 3.7 MIL/uL — AB (ref 3.87–5.11)
RDW: 17.1 % — ABNORMAL HIGH (ref 11.5–15.5)
WBC: 11.5 10*3/uL — ABNORMAL HIGH (ref 4.0–10.5)

## 2017-04-22 LAB — GLUCOSE, CAPILLARY
GLUCOSE-CAPILLARY: 90 mg/dL (ref 65–99)
Glucose-Capillary: 141 mg/dL — ABNORMAL HIGH (ref 65–99)

## 2017-04-22 LAB — BASIC METABOLIC PANEL
Anion gap: 7 (ref 5–15)
BUN: 37 mg/dL — AB (ref 6–20)
CALCIUM: 6.7 mg/dL — AB (ref 8.9–10.3)
CO2: 20 mmol/L — ABNORMAL LOW (ref 22–32)
CREATININE: 1.85 mg/dL — AB (ref 0.44–1.00)
Chloride: 111 mmol/L (ref 101–111)
GFR, EST AFRICAN AMERICAN: 35 mL/min — AB (ref >60)
GFR, EST NON AFRICAN AMERICAN: 30 mL/min — AB (ref >60)
Glucose, Bld: 88 mg/dL (ref 65–99)
Potassium: 4.1 mmol/L (ref 3.5–5.1)
SODIUM: 138 mmol/L (ref 135–145)

## 2017-04-22 LAB — CULTURE, BLOOD (ROUTINE X 2)
CULTURE: NO GROWTH
Culture: NO GROWTH
SPECIAL REQUESTS: ADEQUATE
Special Requests: ADEQUATE

## 2017-04-22 LAB — PROTIME-INR
INR: 1.64
PROTHROMBIN TIME: 19.3 s — AB (ref 11.4–15.2)

## 2017-04-22 MED ORDER — WARFARIN SODIUM 2.5 MG PO TABS: 2.5000 mg | ORAL_TABLET | Freq: Once | ORAL | Status: DC

## 2017-04-22 MED ORDER — LINEZOLID 600 MG PO TABS: 600.0000 mg | ORAL_TABLET | Freq: Two times a day (BID) | ORAL | 0 refills | Status: AC

## 2017-04-22 MED ORDER — HYDROCODONE-ACETAMINOPHEN 5-325 MG PO TABS: 1.0000 | ORAL_TABLET | Freq: Four times a day (QID) | ORAL | 0 refills | Status: AC | PRN

## 2017-04-22 MED ORDER — FUROSEMIDE 40 MG PO TABS: 40.0000 mg | ORAL_TABLET | Freq: Every day | ORAL | 1 refills | Status: AC

## 2017-04-22 NOTE — Progress Notes (Signed)
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  No Known Allergies  Patient Measurements: Height: 5\' 4"  (162.6 cm) Weight: 238 lb 12.8 oz (108.3 kg) IBW/kg (Calculated) : 54.7 Vital Signs: Temp: 98.1 F (36.7 C) (11/03 0630) Temp Source: Oral (11/03 0630) BP: 152/78 (11/03 0630) Pulse Rate: 76 (11/03 0630)  Labs:  Recent Labs  04/20/17 0645 04/21/17 0353 04/22/17 0547  HGB 11.0* 10.8* 11.4*  HCT 33.1* 33.1* 33.1*  PLT 297 317 344  LABPROT 32.0* 29.4* 19.3*  INR 3.14 2.82 1.64  CREATININE 2.74* 2.36* 1.85*    Estimated Creatinine Clearance: 42.7 mL/min (A) (by C-G formula based on SCr of 1.85 mg/dL (H)).   Medical History: Past Medical History:  Diagnosis Date  . Diabetes mellitus without complication (HCC)   . Hypertension   . Renal disorder     [name] presents to the ED with right breast cellulitis, on warfarin PTA for hypercoagulable state.  INR trended up quickly on home regimen and peaked at 3.14. Two doses were held and now patient is subtherapeutic at 1.64. No bleeding noted, CBC is stable.  Warfarin PTA dosing: 5 mg daily   10/29 2.55 (5 mg) 10/30 2.08 (5 mg) 10/31 2.42 (5 mg) 11/1 3.14 (held) 11/2 2.82 (held) 11/3 1.64   Goal of Therapy:  INR 2-3 Monitor platelets by anticoagulation protocol: Yes   Plan:  Warfarin 2.5 mg PO tonight Daily PT/INR Monitor for bleeding   Ladell Gail King, PharmD Pharmacy Resident Phone for today [phone] Main pharmacy - [phone] 04/22/2017 7:44 AM

## 2017-04-22 NOTE — Discharge Summary (Signed)
Physician Discharge Summary  Gail Kussmaullicia Hoctor ZOX:[id number] DOB: 07/29/1965 DOA: 04/16/2017  PCP: Ilona SorrelAdams, Amie Smith, NP  Admit date: 04/16/2017 Discharge date: 04/22/2017  Admitted From: home Disposition:  home  Recommendations for Outpatient Follow-up:  1. Follow up with PCP in 1-2 weeks 2. Continue Linezolid for 10 more days to complete a 14 day course 3. Follow up with Nephrology as an outpatient   Home Health: RN Equipment/Devices: none  Discharge Condition: stable CODE STATUS: Full code Diet recommendation: renal   HPI: Per Dr. Antionette Charpyd, Gail Kussmaullicia Milford is a 51 y.o. 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  Patient had [facility] until 04/13/2017 for IV diuresis as she had developed anasarca, likely secondary to nephropathy with serum albumin of 1.1.  She underwent IV diuresis and had a mild increase in her creatinine at that time.  She had profuse watery diarrhea with negative C. difficile testing, attributed to cyclosporine and magnesium oxide.  Cyclosporine was changed to Prograf and her diuretics were adjusted in light of the worsening renal function.  She was discharged in much improved and stable condition, but has since developed fevers and chills with purulent drainage from the upper right breast.  Reports continued adherence with her medications. [facility] ED Course: Upon arrival to the ED, patient is found to be febrile to 38.1 degree C, saturating well on room air, tachycardic in the 110s, and with vitals otherwise stable.  Chemistry panel is notable for a BUN of 37 and creatinine 1.90, up from 1.5 at time of recent hospital discharge.  CBC is notable for leukocytosis 12,800 and lactic acid is  reassuring at 0.97.  Patient was treated with vancomycin in the emergency department and general surgery was consulted by the ED physician.  Patient remained hemodynamically stable and in no apparent respiratory distress, and she will be admitted to the telemetry unit at [facility] for ongoing evaluation and management of right upper breast cellulitis.  Hospital Course: Discharge Diagnoses:  Principal Problem:   Cellulitis of right breast Active Problems:   Hypoalbuminemia   Watery diarrhea   AKI (acute kidney injury) (HCC)   Membranous nephrosis   Acute kidney injury (HCC)   Cellulitis of breast   Sepsis secondary to cellulitis right breast -General surgery was consulted and have.  She went to the operating room on 10/30 and she is status post I&D.  Wound cultures grew MRSA which was sensitive to Bactrim, otherwise he was resistant to doxycycline and clindamycin.  Given membranous glomerulonephritis, nephrology recommends avoiding Bactrim.  The patient was maintained on IV vancomycin while hospitalized, and she will be transitioned to Zyvox on discharge.  Plan was discussed with infectious disease over the phone.  There was an initial concern that she has shingles in that area, however given presence of abscess as well as MRSA this is less likely.  She will not need any antivirals on discharge.  Her blood cultures have remained negative at 5 days.  She will need 10 additional days of Zyvox, which should complete a 14-day course from the time of surgery. Nephrotic syndrome/membranous GN /acute kidney injury-Diagnosed by biopsy in 01/2017. [facility]10[facility]10/25) for anasarca, diuresed.  Her renal function has remained stable here, improving on discharge.  Nephrology  was consulted and has followed patient while hospitalized.  Recommended continue Lasix, hold Cozaar, Coumadin for hypercoagulable state, and continue tacrolimus as well as prednisone. Diarrhea -has  history of IBS-D.  GI pathogen panel negative, C. difficile could not be sent since her diarrhea has improved, but less likely infectious since this is temporally related to the above diagnosis and being placed on multiple medications for her membranous, nephritis DM type 2 -resume home medication Major depression, recurrent/panic disorder/insomnia -resume home medications   Discharge Instructions   Allergies as of 04/22/2017   No Known Allergies     Medication List    STOP taking these medications   losartan 50 MG tablet Commonly known as:  COZAAR   spironolactone 25 MG tablet Commonly known as:  ALDACTONE   torsemide 20 MG tablet Commonly known as:  DEMADEX     TAKE these medications   dicyclomine 10 MG capsule Commonly known as:  BENTYL Take 10 mg by mouth 4 (four) times daily -  before meals and at bedtime.   FLUoxetine 10 MG capsule Commonly known as:  PROZAC Take 10 mg by mouth daily.   furosemide 40 MG tablet Commonly known as:  LASIX Take 1 tablet (40 mg total) by mouth daily.   glipiZIDE 5 MG tablet Commonly known as:  GLUCOTROL Take 5 mg by mouth daily before breakfast.   HYDROcodone-acetaminophen 5-325 MG tablet Commonly known as:  NORCO/VICODIN Take 1-2 tablets by mouth every 6 (six) hours as needed for moderate pain.   linezolid 600 MG tablet Commonly known as:  ZYVOX Take 1 tablet (600 mg total) by mouth 2 (two) times daily. Dispense generic   ondansetron 4 MG tablet Commonly known as:  ZOFRAN Take 4 mg by mouth every 8 (eight) hours as needed for nausea or vomiting.   predniSONE 10 MG tablet Commonly known as:  DELTASONE Take 10 mg by mouth daily with breakfast.   QUEtiapine 50 MG tablet Commonly known as:  SEROQUEL Take 50 mg by mouth at bedtime.   tacrolimus 1 MG capsule Commonly known as:  PROGRAF Take 2 mg by mouth every 12 (twelve) hours.   warfarin 5 MG tablet Commonly known as:  COUMADIN Take 5 mg by mouth daily at 6 PM.        Follow-up Information    Abigail Miyamoto, MD Follow up.   Specialty:  General Surgery Why:  call as soon as possiblet to schedule a follow up appointment from recent surgery in 1-2 weeks Contact information: [address] [place] [address] [phone]        [facility] Follow up.   Why:  home health service arranged, office will call and set up home visit Contact information: [address] [place] [address] [phone]        Ilona Sorrel, NP. Schedule an appointment as soon as possible for a visit in 2 week(s).   Specialty:  Infectious Diseases Contact information: [address] [place] [address] [place] [address] [phone]           Consultations:  Nephrology  General surgery  Procedures/Studies:  I&D 10/30  Dg Chest 2 View  Result Date: 04/16/2017 CLINICAL DATA:  Fever with abscess to the right axilla EXAM: CHEST  2 VIEW COMPARISON:  None. FINDINGS: Small left greater than right pleural effusion. Hazy atelectasis or infiltrate at the left base. Normal heart size. No pneumothorax. Old right fifth rib fracture IMPRESSION: Small left greater  than right pleural effusion with left basilar atelectasis or infiltrate Electronically Signed   By: Jasmine Pang M.D.   On: 04/16/2017 17:10   Ct Chest Wo Contrast  Result Date: 04/16/2017 CLINICAL DATA:  Chest wall pain. Recent diagnosis of nephrotic syndrome. Patient on immunosuppressants and steroids. Presents today with severe pain in her right breast with redness and drainage. EXAM: CT CHEST WITHOUT CONTRAST TECHNIQUE: Multidetector CT imaging of the chest was performed following the standard protocol without IV contrast. COMPARISON:  Chest x-ray from earlier today FINDINGS: Cardiovascular: Coronary artery calcifications are identified. The heart size is normal. The thoracic aorta demonstrates mild atherosclerotic change. No aneurysm identified. Central  pulmonary arteries are normal in caliber. Mediastinum/Nodes: Small bilateral pleural effusions are seen. A tiny amount of pericardial fluid is noted. The thyroid and esophagus are normal. No adenopathy in the mediastinum or hila. Increased subcutaneous edema identified. There is skin thickening associated with both breasts. No definitive abscess or mass is seen. Lungs/Pleura: Central airways are normal. No pneumothorax. No overt edema. Atelectasis underlies the bilateral effusions. No suspicious infiltrate to suggest pneumonia. No suspicious nodules or masses. Upper Abdomen: No acute abnormality. Musculoskeletal: No chest wall mass or suspicious bone lesions identified. IMPRESSION: 1. The patient's symptoms of breast pain with redness and drainage could be seen with edema, mastitis, or inflammatory breast cancer. No obvious abscess or mass seen on today's imaging. However, mammography and ultrasound would be the studies of choice rather than CT. Recommend referral to the [facility] for further evaluation. 2. Pleural effusions and subcutaneous edema likely represent volume overload. No pulmonary edema identified. 3. Coronary artery calcifications. Mild atherosclerotic change in the thoracic aorta. 4. No other acute abnormalities. Aortic Atherosclerosis (ICD10-I70.0). Electronically Signed   By: Gerome Sam III M.D   On: 04/16/2017 20:53     Subjective: - no chest pain, shortness of breath, no abdominal pain, nausea or vomiting.   Discharge Exam: Vitals:   04/22/17 0630 04/22/17 1450  BP: (!) 152/78 (!) 144/88  Pulse: 76 93  Resp: 16 20  Temp: 98.1 F (36.7 C) 98.2 F (36.8 C)  SpO2: 96% 97%    General: Pt is alert, awake, not in acute distress Cardiovascular: RRR, S1/S2 +, no rubs, no gallops Respiratory: CTA bilaterally, no wheezing, no rhonchi Abdominal: Soft, NT, ND, bowel sounds + Extremities: no edema, no cyanosis   The results of significant diagnostics from this hospitalization  (including imaging, microbiology, ancillary and laboratory) are listed below for reference.     Microbiology: Recent Results (from the past 240 hour(s))  Culture, blood (Routine X 2) w Reflex to ID Panel     Status: None   Collection Time: 04/16/17  6:15 PM  Result Value Ref Range Status   Specimen Description BLOOD RIGHT ANTECUBITAL  Final   Special Requests   Final    Blood Culture adequate volume BOTTLES DRAWN AEROBIC AND ANAEROBIC   Culture   Final    NO GROWTH 5 DAYS Performed at [facility], [address] [place], [place] [address]    Report Status 04/22/2017 FINAL  Final  Culture, blood (Routine X 2) w Reflex to ID Panel     Status: None   Collection Time: 04/16/17  6:15 PM  Result Value Ref Range Status   Specimen Description BLOOD LEFT ANTECUBITAL  Final   Special Requests   Final    BOTTLES DRAWN AEROBIC AND ANAEROBIC Blood Culture adequate volume   Culture   Final  NO GROWTH 5 DAYS Performed at [facility], [address] [place], [place] [address]    Report Status 04/22/2017 FINAL  Final  Gastrointestinal Panel by PCR , Stool     Status: None   Collection Time: 04/17/17  1:11 PM  Result Value Ref Range Status   Campylobacter species NOT DETECTED NOT DETECTED Final   Plesimonas shigelloides NOT DETECTED NOT DETECTED Final   Salmonella species NOT DETECTED NOT DETECTED Final   Yersinia enterocolitica NOT DETECTED NOT DETECTED Final   Vibrio species NOT DETECTED NOT DETECTED Final   Vibrio cholerae NOT DETECTED NOT DETECTED Final   Enteroaggregative E coli (EAEC) NOT DETECTED NOT DETECTED Final   Enteropathogenic E coli (EPEC) NOT DETECTED NOT DETECTED Final   Enterotoxigenic E coli (ETEC) NOT DETECTED NOT DETECTED Final   Shiga like toxin producing E coli (STEC) NOT DETECTED NOT DETECTED Final   Shigella/Enteroinvasive E coli (EIEC) NOT DETECTED NOT DETECTED Final   Cryptosporidium NOT DETECTED NOT DETECTED Final   Cyclospora cayetanensis NOT  DETECTED NOT DETECTED Final   Entamoeba histolytica NOT DETECTED NOT DETECTED Final   Giardia lamblia NOT DETECTED NOT DETECTED Final   Adenovirus F40/41 NOT DETECTED NOT DETECTED Final   Astrovirus NOT DETECTED NOT DETECTED Final   Norovirus GI/GII NOT DETECTED NOT DETECTED Final   Rotavirus A NOT DETECTED NOT DETECTED Final   Sapovirus (I, II, IV, and V) NOT DETECTED NOT DETECTED Final  Aerobic/Anaerobic Culture (surgical/deep wound)     Status: None (Preliminary result)   Collection Time: 04/18/17 12:11 PM  Result Value Ref Range Status   Specimen Description ABSCESS RIGHT AXILLA  Final   Special Requests NONE  Final   Gram Stain   Final    MODERATE WBC PRESENT,BOTH PMN AND MONONUCLEAR MODERATE GRAM POSITIVE COCCI    Culture   Final    MODERATE METHICILLIN RESISTANT STAPHYLOCOCCUS AUREUS NO ANAEROBES ISOLATED; CULTURE IN PROGRESS FOR 5 DAYS    Report Status PENDING  Incomplete   Organism ID, Bacteria METHICILLIN RESISTANT STAPHYLOCOCCUS AUREUS  Final      Susceptibility   Methicillin resistant staphylococcus aureus - MIC*    CIPROFLOXACIN >=8 RESISTANT Resistant     ERYTHROMYCIN >=8 RESISTANT Resistant     GENTAMICIN <=0.5 SENSITIVE Sensitive     OXACILLIN >=4 RESISTANT Resistant     TETRACYCLINE >=16 RESISTANT Resistant     VANCOMYCIN <=0.5 SENSITIVE Sensitive     TRIMETH/SULFA <=10 SENSITIVE Sensitive     CLINDAMYCIN >=8 RESISTANT Resistant     RIFAMPIN <=0.5 SENSITIVE Sensitive     Inducible Clindamycin NEGATIVE Sensitive     * MODERATE METHICILLIN RESISTANT STAPHYLOCOCCUS AUREUS     Labs: BNP (last 3 results) No results for input(s): BNP in the last 8760 hours. Basic Metabolic Panel:  Recent Labs Lab 04/18/17 0539 04/19/17 0525 04/20/17 0645 04/21/17 0353 04/22/17 0547  NA 138 137 137 137 138  K 3.9 4.0 3.6 3.7 4.1  CL 103 107 106 108 111  CO2 25 21* 23 22 20*  GLUCOSE 96 157* 102* 95 88  BUN 42* 44* 48* 47* 37*  CREATININE 2.68* 2.71* 2.74* 2.36* 1.85*    CALCIUM 7.2* 6.7* 6.7* 6.7* 6.7*  MG 1.7 1.7 1.8 1.7 1.8   Liver Function Tests:  Recent Labs Lab 04/16/17 1628 04/17/17 0545  AST 20 12*  ALT 18 12*  ALKPHOS 109 83  BILITOT 0.9 0.9  PROT 5.1* 4.1*  ALBUMIN 1.1* <1.0*   No results for  input(s): LIPASE, AMYLASE in the last 168 hours. No results for input(s): AMMONIA in the last 168 hours. CBC:  Recent Labs Lab 04/16/17 1628 04/17/17 0545 04/18/17 0539 04/19/17 0525 04/20/17 0645 04/21/17 0353 04/22/17 0547  WBC 12.8* 9.9 13.3* 11.3* 9.5 9.4 11.5*  NEUTROABS 10.4* 7.7  --   --   --   --   --   HGB 13.3 11.3* 12.0 10.6* 11.0* 10.8* 11.4*  HCT 40.1 34.0* 35.9* 32.2* 33.1* 33.1* 33.1*  MCV 89.9 89.7 90.2 89.4 89.9 89.9 89.5  PLT 253 223 289 306 297 317 344   Cardiac Enzymes: No results for input(s): CKTOTAL, CKMB, CKMBINDEX, TROPONINI in the last 168 hours. BNP: Invalid input(s): POCBNP CBG:  Recent Labs Lab 04/21/17 1227 04/21/17 1722 04/21/17 2132 04/22/17 0817 04/22/17 1217  GLUCAP 156* 146* 176* 90 141*   D-Dimer No results for input(s): DDIMER in the last 72 hours. Hgb A1c No results for input(s): HGBA1C in the last 72 hours. Lipid Profile No results for input(s): CHOL, HDL, LDLCALC, TRIG, CHOLHDL, LDLDIRECT in the last 72 hours. Thyroid function studies No results for input(s): TSH, T4TOTAL, T3FREE, THYROIDAB in the last 72 hours.  Invalid input(s): FREET3 Anemia work up No results for input(s): VITAMINB12, FOLATE, FERRITIN, TIBC, IRON, RETICCTPCT in the last 72 hours. Urinalysis    Component Value Date/Time   COLORURINE YELLOW 04/16/2017 2330   APPEARANCEUR HAZY (A) 04/16/2017 2330   LABSPEC 1.023 04/16/2017 2330   PHURINE 9.0 (H) 04/16/2017 2330   GLUCOSEU 150 (A) 04/16/2017 2330   HGBUR NEGATIVE 04/16/2017 2330   BILIRUBINUR NEGATIVE 04/16/2017 2330   KETONESUR NEGATIVE 04/16/2017 2330   PROTEINUR >=300 (A) 04/16/2017 2330   NITRITE NEGATIVE 04/16/2017 2330   LEUKOCYTESUR NEGATIVE  04/16/2017 2330   Sepsis Labs Invalid input(s): PROCALCITONIN,  WBC,  LACTICIDVEN   Time coordinating discharge: 35 minutes  SIGNED:  Pamella Pert, MD  [facility] 04/22/2017, 3:40 PM Pager [phone]  If 7PM-7AM, please contact night-coverage [email or web address] Password [email or web address]

## 2017-04-22 NOTE — Progress Notes (Signed)
[facility] Progress Note    Assessment/ Plan:    Acute kidney injury superimposed on CKD, improving Significant improvement in creatinine and BUN overnight. Creat 2.36>>1.85, BUN 47>>37. Patient was started on Lasix 40 mg po yesterday for bilateral LE edema. UOP not recorded however LE improving with good UOP per patient. AKI was likely hemodynamically mediated in setting of sepsis/ diuresis/ third spacing.  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 For now, appropriate to continue CNI and steroids. Could consider restarting  ARB as kidney function continue to improve.  --Continue Lasix 40 mg po daily.   --Continue Prednisone 10 mg daily  --Continue Tacrolimus 1 mg bid po  --Continue holding Losartan --tac trough 7.4 (this was when she was taking 2 mg BID); would consider going back to 2 mg BID as outpatient with her nephrologist (Drs. Adegoye and Stovall)  Anasarca, slowly improving Creatinine and BUN continue to improve 1.85, 37. Patient continue to have bilateral LE edema with mild basilar crackles. Would restart lasix an monitor UOP and volume status.  -Continue Lasix 40 mg po daily -Daily weights and I/O  Breast cellulitis with abscess vs shingles, improving Currently on vancomycin and valtrex. S/p I and d with gen surg. Cultures growing MRSA. Pain has improved and appears to healing well. Surgery has signed off.  Can be transition to po antibiotics per primary team. Linezolid should be OK with tacrolimus if that's the choice, doxycycline also OK.    Anticoagulation Patient on Coumadin for hypercoagulable state. Last INR 1.64 (goal 2-3).   Subjective:   Feels close to her baseline and ready for discharge.   Objective:   BP (!) 152/78 (BP Location: Left Arm)   Pulse 76   Temp 98.1 F (36.7 C) (Oral)   Resp 16   Ht 5\' 4"  (1.626 m)   Wt 238 lb 12.8 oz (108.3 kg)   SpO2 96%   BMI 40.99 kg/m   Intake/Output Summary (Last 24 hours) at  04/22/17 1036 Last data filed at 04/22/17 0900  Gross per 24 hour  Intake              456 ml  Output                0 ml  Net              456 ml   Weight change:   Physical Exam: GEN NAD, looks like she feels much better, standing and walking arouns HEENT EOMI, PERRL, MMM NECK no JVD BREAST: R breast dressed, dressings c/d/i PULM clear bilaterally, able to lay flat without difficulty CV RRR no m/r/g ABD NABS EXT 1+ upper and LE edema, a little abd wall fullness. NEURO AAO x 3  Imaging: No results found.  Labs: BMET  Recent Labs Lab 04/16/17 1628 04/17/17 0545 04/18/17 0539 04/19/17 0525 04/20/17 0645 04/21/17 0353 04/22/17 0547  NA 138 140 138 137 137 137 138  K 3.5 3.2* 3.9 4.0 3.6 3.7 4.1  CL 104 107 103 107 106 108 111  CO2 27 24 25  21* 23 22 20*  GLUCOSE 155* 93 96 157* 102* 95 88  BUN 37* 36* 42* 44* 48* 47* 37*  CREATININE 1.90* 2.26* 2.68* 2.71* 2.74* 2.36* 1.85*  CALCIUM 7.5* 7.0* 7.2* 6.7* 6.7* 6.7* 6.7*   CBC  Recent Labs Lab 04/16/17 1628 04/17/17 0545  04/19/17 0525 04/20/17 0645 04/21/17 0353 04/22/17 0547  WBC 12.8* 9.9  < > 11.3* 9.5 9.4 11.5*  NEUTROABS 10.4* 7.7  --   --   --   --   --   HGB 13.3 11.3*  < > 10.6* 11.0* 10.8* 11.4*  HCT 40.1 34.0*  < > 32.2* 33.1* 33.1* 33.1*  MCV 89.9 89.7  < > 89.4 89.9 89.9 89.5  PLT 253 223  < > 306 297 317 344  < > = values in this interval not displayed.  Medications:    . acetaminophen  650 mg Oral BID  . dicyclomine  10 mg Oral TID AC & HS  . FLUoxetine  10 mg Oral Daily  . insulin aspart  0-5 Units Subcutaneous QHS  . insulin aspart  0-9 Units Subcutaneous TID WC  . predniSONE  10 mg Oral Q breakfast  . QUEtiapine  25 mg Oral QHS  . sodium chloride flush  3 mL Intravenous Q12H  . tacrolimus  1 mg Oral BID  . valACYclovir  1,000 mg Oral Q24H  . warfarin  2.5 mg Oral ONCE-1800  . Warfarin - Pharmacist Dosing Inpatient   Does not apply q1800     Abdoulaye Diallo,MD 04/22/2017 10:36  AM   I have personally seen and examined this patient and agree with the assessment/plan as outlined above. Leata Dominy,MD 04/22/2017 1:15 PM

## 2017-04-22 NOTE — Progress Notes (Signed)
Ara KussmaulAlicia Woodburn to be D/C'd Home per MD order. Discussed with the patient and daughters and all questions fully answered.  Allergies as of 04/22/2017   No Known Allergies     Medication List    STOP taking these medications   losartan 50 MG tablet Commonly known as:  COZAAR   spironolactone 25 MG tablet Commonly known as:  ALDACTONE   torsemide 20 MG tablet Commonly known as:  DEMADEX     TAKE these medications   dicyclomine 10 MG capsule Commonly known as:  BENTYL Take 10 mg by mouth 4 (four) times daily -  before meals and at bedtime.   FLUoxetine 10 MG capsule Commonly known as:  PROZAC Take 10 mg by mouth daily.   furosemide 40 MG tablet Commonly known as:  LASIX Take 1 tablet (40 mg total) by mouth daily.   glipiZIDE 5 MG tablet Commonly known as:  GLUCOTROL Take 5 mg by mouth daily before breakfast.   HYDROcodone-acetaminophen 5-325 MG tablet Commonly known as:  NORCO/VICODIN Take 1-2 tablets by mouth every 6 (six) hours as needed for moderate pain.   linezolid 600 MG tablet Commonly known as:  ZYVOX Take 1 tablet (600 mg total) by mouth 2 (two) times daily. Dispense generic   ondansetron 4 MG tablet Commonly known as:  ZOFRAN Take 4 mg by mouth every 8 (eight) hours as needed for nausea or vomiting.   predniSONE 10 MG tablet Commonly known as:  DELTASONE Take 10 mg by mouth daily with breakfast.   QUEtiapine 50 MG tablet Commonly known as:  SEROQUEL Take 50 mg by mouth at bedtime.   tacrolimus 1 MG capsule Commonly known as:  PROGRAF Take 2 mg by mouth every 12 (twelve) hours.   warfarin 5 MG tablet Commonly known as:  COUMADIN Take 5 mg by mouth daily at 6 PM.       VVS, Skin clean, dressing change completed prior to discharge. Extra supplies given to family to perform at home dressing [facility]. IV catheter discontinued intact. Site without signs and symptoms of complications. Dressing and pressure applied.  An After Visit Summary  and prescriptions were printed and given to the patient.  Patient escorted via WC, and D/C home via private auto.  Jon Gillslisa R Shams Fill  04/22/2017 3:52 PM

## 2017-04-22 NOTE — Progress Notes (Addendum)
[facility] Progress Note  4 Days Post-Op  Subjective: CC:  Right axillary pain/pressure improving daily. Daughters helped with drsg changes yesterday. Feels ok to go home.   Objective: Vital signs in last 24 hours: Temp:  [98.1 F (36.7 C)-98.4 F (36.9 C)] 98.1 F (36.7 C) (11/03 0630) Pulse Rate:  [73-84] 76 (11/03 0630) Resp:  [16-18] 16 (11/03 0630) BP: (121-152)/(66-78) 152/78 (11/03 0630) SpO2:  [93 %-96 %] 96 % (11/03 0630) Weight:  [108.3 kg (238 lb 12.8 oz)] 108.3 kg (238 lb 12.8 oz) (11/03 0211) Last BM Date: 04/21/17  Intake/Output from previous day: 11/02 0701 - 11/03 0700 In: 220 [P.O.:220] Out: 2 [Urine:2] Intake/Output this shift: No intake/output data recorded.  PE: Gen: Alert, NAD, cooperative  Pulm: normal respiratory effort  Skin/Right axillary wound:there is a 5 cm proximal incision between axilla and upper-outer quadrant right breast with a small counter incision inferiorly around anterior axillary line. cellulitis surrounding the incisions resolved. No residual fluctuance. Appropriately tender - tenderness improving.  Lab Results:   Recent Labs  04/21/17 0353 04/22/17 0547  WBC 9.4 11.5*  HGB 10.8* 11.4*  HCT 33.1* 33.1*  PLT 317 344   BMET  Recent Labs  04/21/17 0353 04/22/17 0547  NA 137 138  K 3.7 4.1  CL 108 111  CO2 22 20*  GLUCOSE 95 88  BUN 47* 37*  CREATININE 2.36* 1.85*  CALCIUM 6.7* 6.7*   PT/INR  Recent Labs  04/21/17 0353 04/22/17 0547  LABPROT 29.4* 19.3*  INR 2.82 1.64   CMP     Component Value Date/Time   NA 138 04/22/2017 0547   K 4.1 04/22/2017 0547   CL 111 04/22/2017 0547   CO2 20 (L) 04/22/2017 0547   GLUCOSE 88 04/22/2017 0547   BUN 37 (H) 04/22/2017 0547   CREATININE 1.85 (H) 04/22/2017 0547   CALCIUM 6.7 (L) 04/22/2017 0547   PROT 4.1 (L) 04/17/2017 0545   ALBUMIN <1.0 (L) 04/17/2017 0545   AST 12 (L) 04/17/2017 0545   ALT 12 (L) 04/17/2017 0545   ALKPHOS 83 04/17/2017 0545    BILITOT 0.9 04/17/2017 0545   GFRNONAA 30 (L) 04/22/2017 0547   GFRAA 35 (L) 04/22/2017 0547   Anti-infectives: Anti-infectives    Start     Dose/Rate Route Frequency Ordered Stop   04/21/17 1800  vancomycin (VANCOCIN) 500 mg in sodium chloride 0.9 % 100 mL IVPB     500 mg 100 mL/hr over 60 Minutes Intravenous Every 24 hours 04/20/17 1939     04/18/17 1800  valACYclovir (VALTREX) tablet 1,000 mg     1,000 mg Oral Every 24 hours 04/18/17 1703     04/17/17 1900  vancomycin (VANCOCIN) IVPB 1000 mg/200 mL premix  Status:  Discontinued     1,000 mg 200 mL/hr over 60 Minutes Intravenous Every 24 hours 04/16/17 2211 04/20/17 1933   04/17/17 1600  acyclovir (ZOVIRAX) 200 MG capsule 800 mg  Status:  Discontinued     800 mg Oral 5 times daily 04/17/17 1504 04/17/17 1523   04/17/17 1600  acyclovir (ZOVIRAX) tablet 800 mg  Status:  Discontinued     800 mg Oral 5 times daily 04/17/17 1523 04/18/17 1703   04/16/17 1830  vancomycin (VANCOCIN) IVPB 1000 mg/200 mL premix     1,000 mg 200 mL/hr over 60 Minutes Intravenous  Once 04/16/17 1819 04/16/17 1932     Assessment/Plan Right breast cellulitis/abscess S/p I&D right axillary abscess 10/30 Dr. Abigail Miyamotoouglas Blackman  -  afebrile, VSS - GS growing staph aurius; transition to Zyvox   - BID wet-to-dry dressing changes with PO pain control (5-325 mg NORCO + 650 mg Tylenol) - PRN pain control   - HH RN for dressing changes, daughter to come in for wound care  FEN: carb mod diet  ID: Vancomycin 10/28-11/2, Zyvox  VTE: SCD's, warfarin per pharmacy for nephrotic syndrome  Foley: none  Follow up: Dr. Abigail Miyamoto in 1-2 weeks   Patient may be discharged home from surgical perspective. Would continue on oral antibiotics for a total of 7 days therapy. Attempt to change dressing BID for first week home and follow up in [facility] office as above.   LOS: 6 days    Berna Bue , MD [facility]  Surgery 04/22/2017, 8:04 AM

## 2017-04-23 LAB — AEROBIC/ANAEROBIC CULTURE W GRAM STAIN (SURGICAL/DEEP WOUND)

## 2017-04-23 LAB — AEROBIC/ANAEROBIC CULTURE (SURGICAL/DEEP WOUND)

## 2018-12-17 IMAGING — CR DG CHEST 2V
2 series · 2 of 2 positions shown · non-contrast
Comparison: None.

CLINICAL DATA: Fever with abscess to the right axilla

EXAM:
CHEST  2 VIEW

[w chest pa]
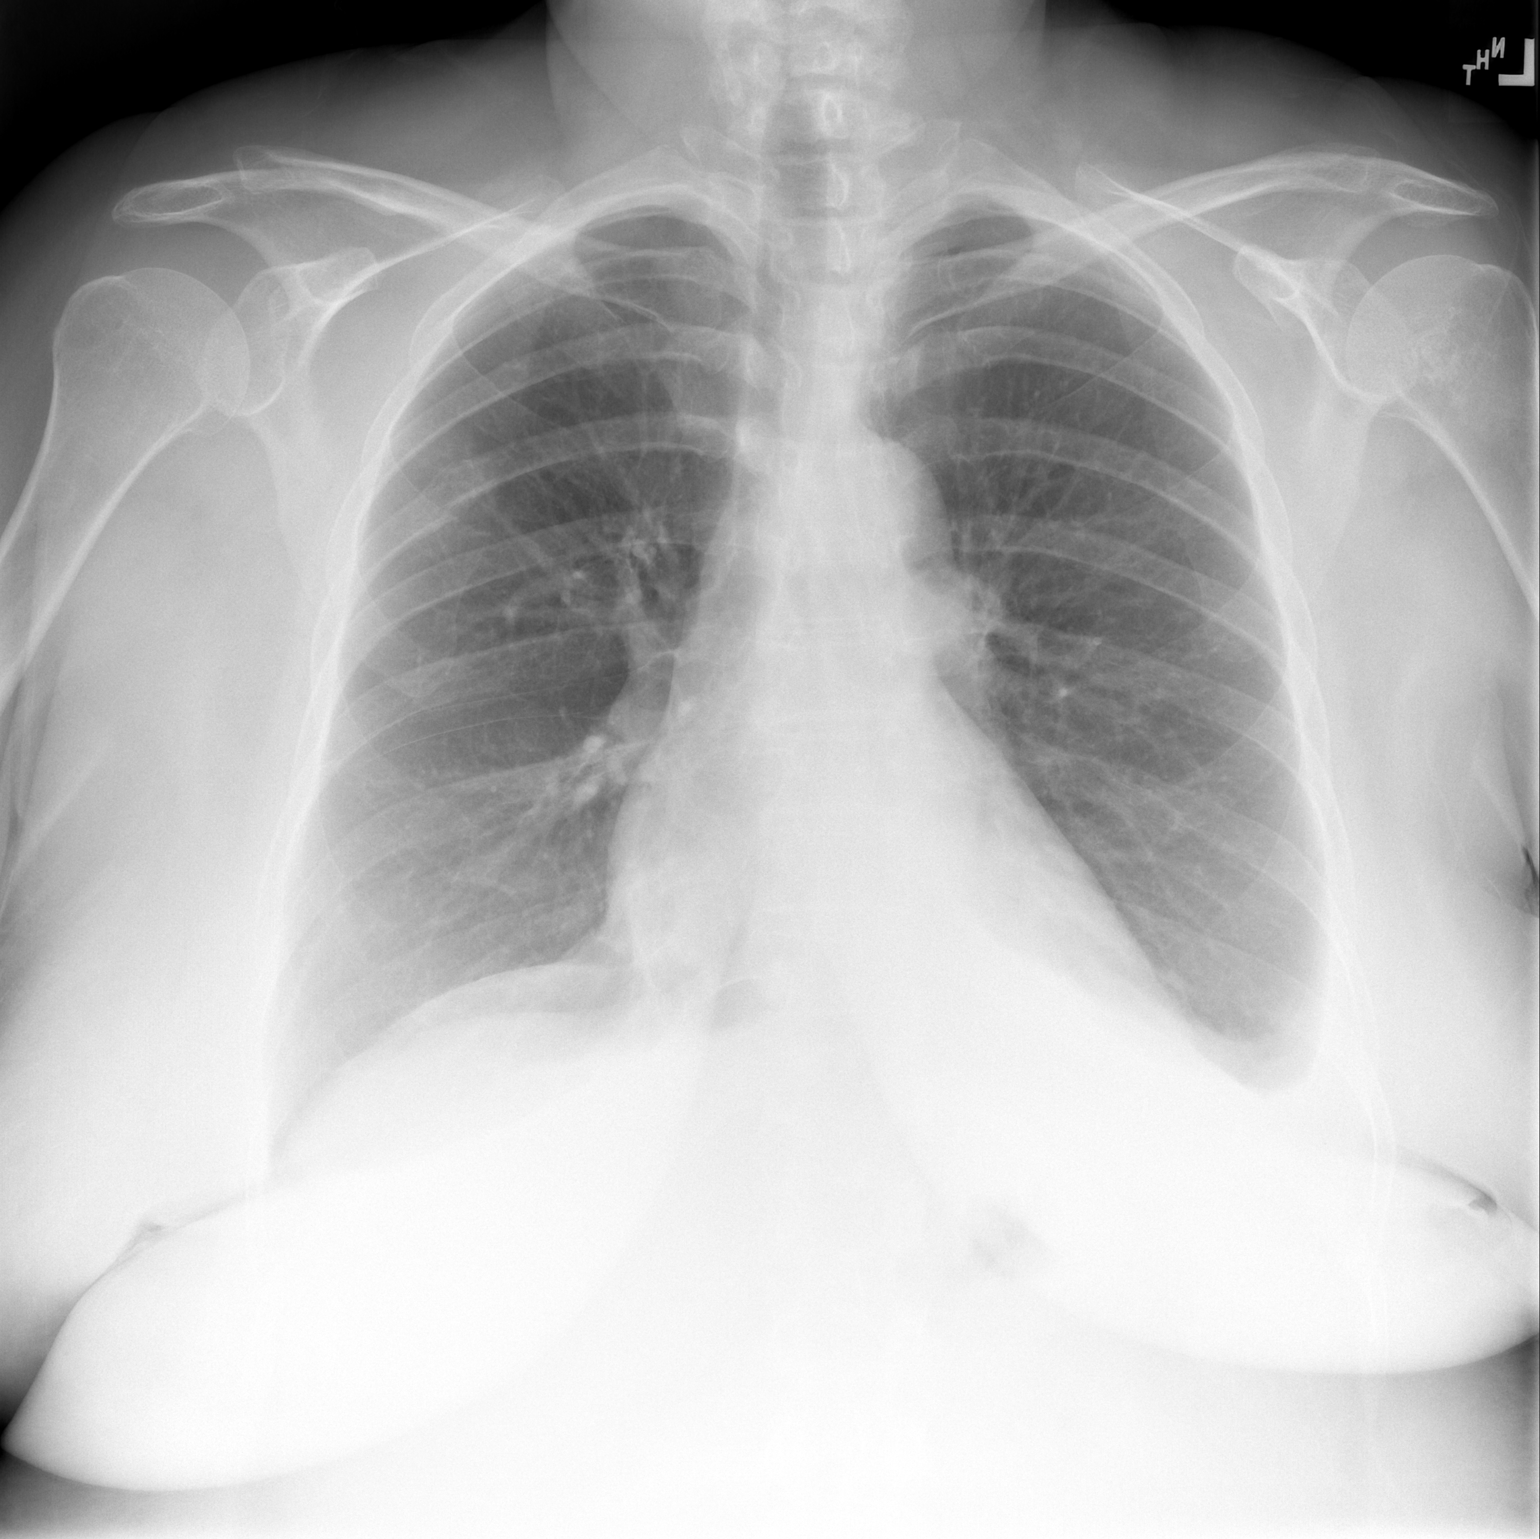

[w chest lat]
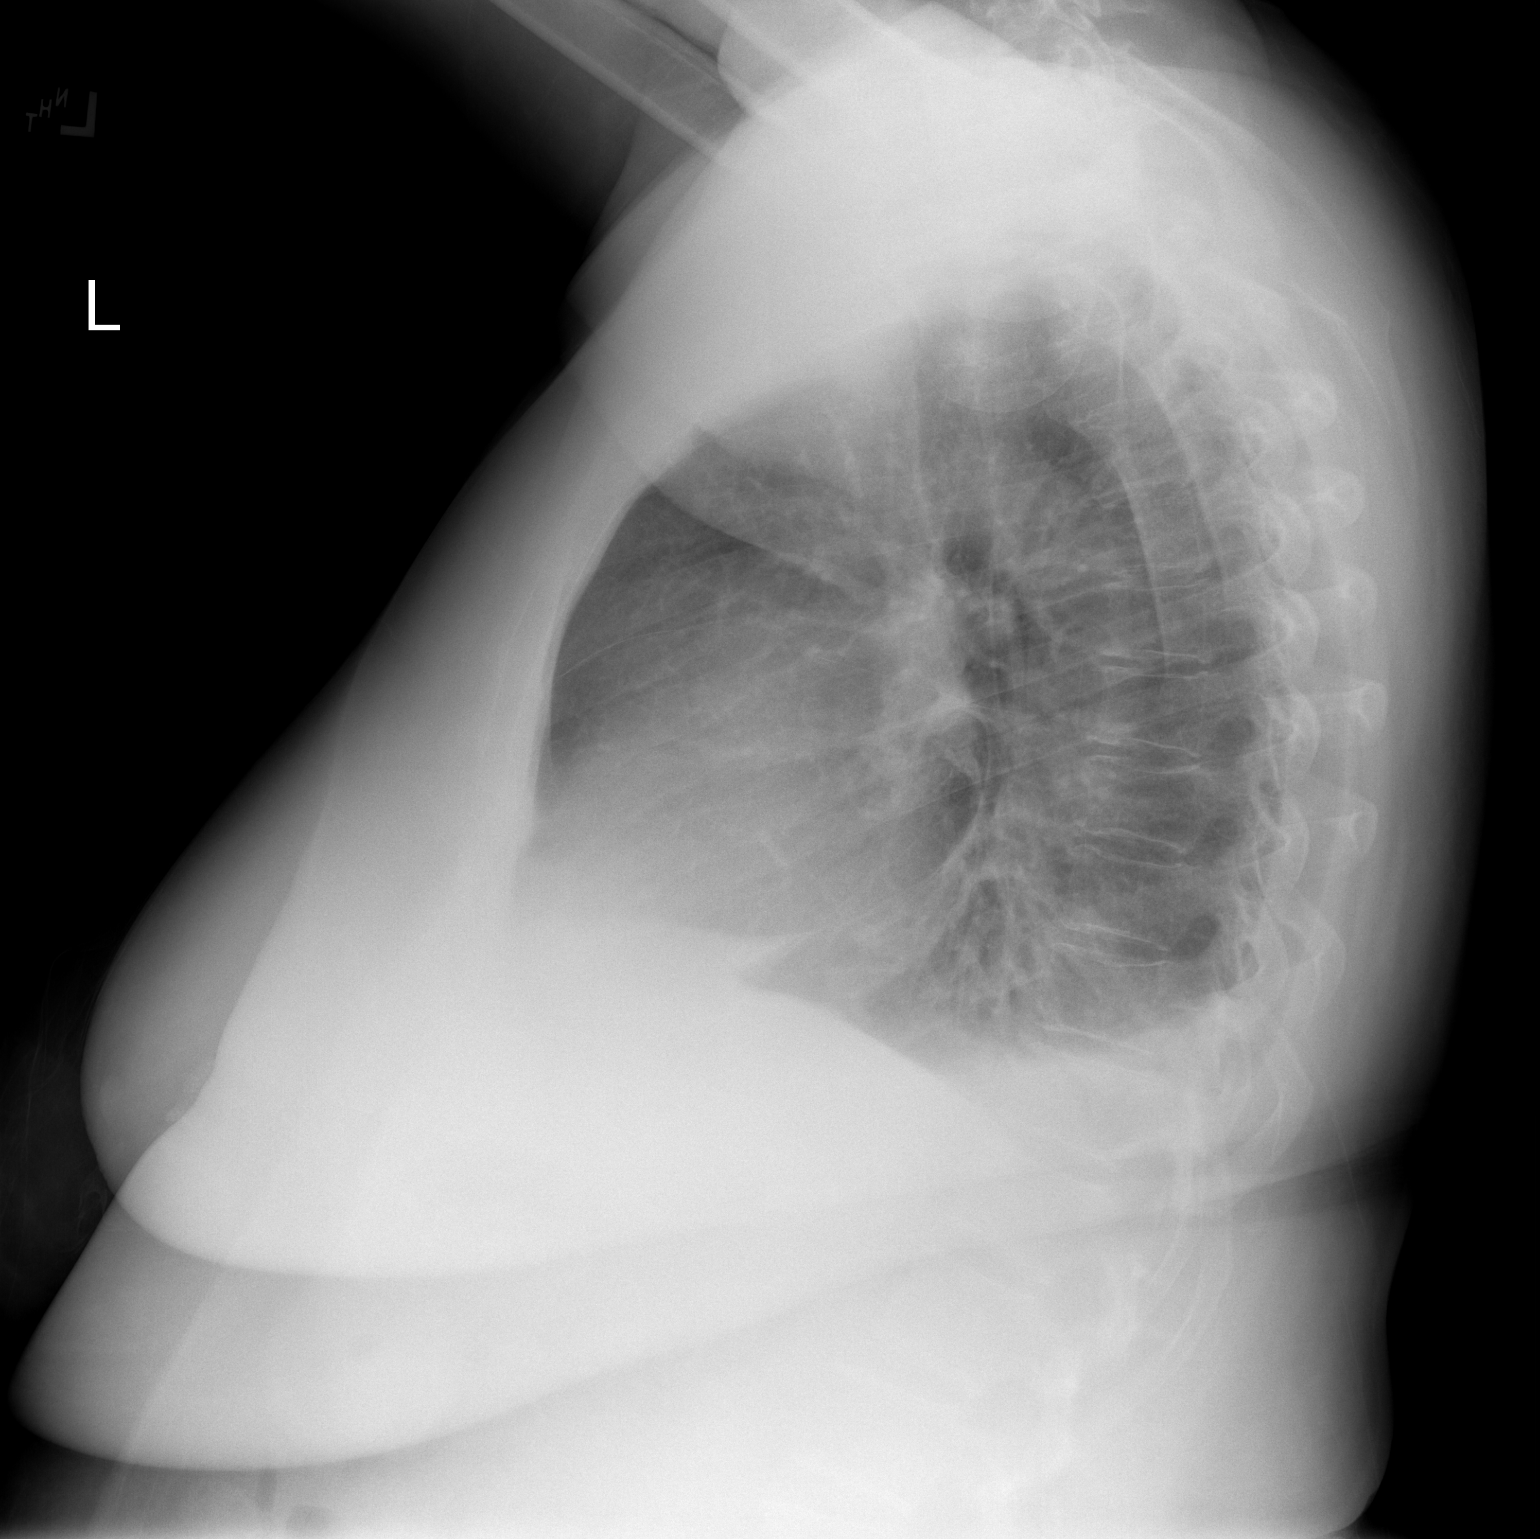

[2 of 2 positions shown; findings below may reference images not displayed]

FINDINGS: Small left greater than right pleural effusion. Hazy atelectasis or
infiltrate at the left base. Normal heart size. No pneumothorax. Old
right fifth rib fracture
IMPRESSION: Small left greater than right pleural effusion with left basilar
atelectasis or infiltrate

## 2018-12-17 IMAGING — CT CT CHEST W/O CM
2 of 3 series · 14 of 36 positions shown, 17 images · non-contrast
Comparison: Chest x-ray from earlier today

CLINICAL DATA: Chest wall pain. Recent diagnosis of nephrotic
syndrome. Patient on immunosuppressants and steroids. Presents today
with severe pain in her right breast with redness and drainage.

EXAM:
CT CHEST WITHOUT CONTRAST
TECHNIQUE: Multidetector CT imaging of the chest was performed following the
standard protocol without IV contrast.

[Series 2: thorax · axial · 0.98mm/px · z∈[+1020,+1262]mm · 11 of 143 slices shown, 14 images]
[im 11/143  mediastinal]
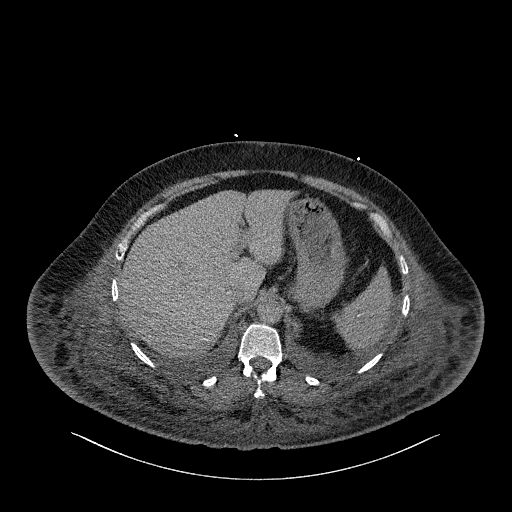
[im 11/143  lung]
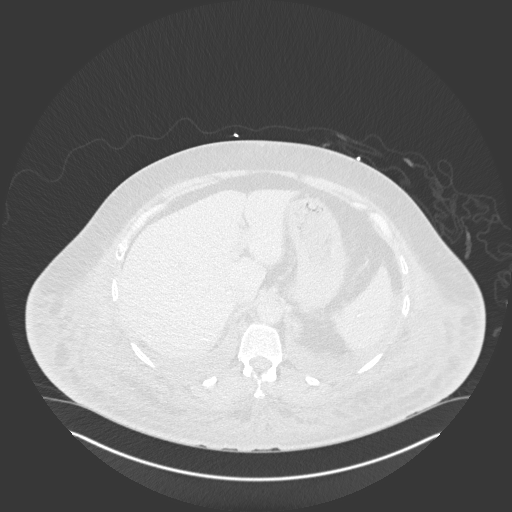
[im 22/143  lung]
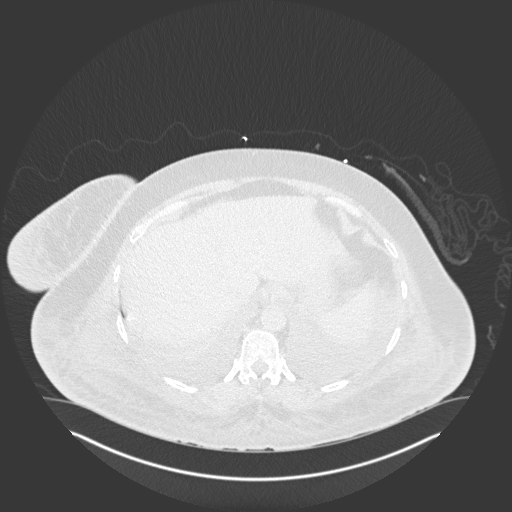
[im 32/143  lung]
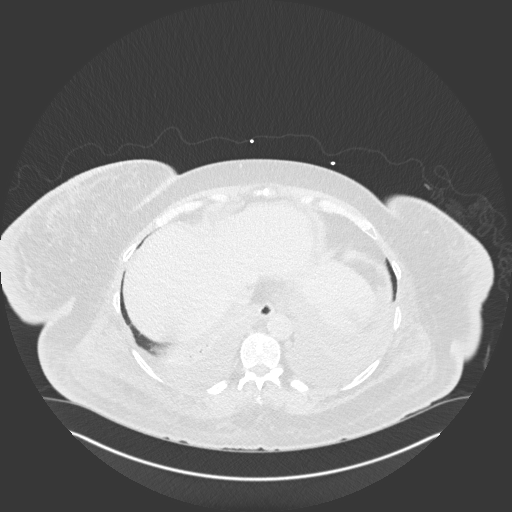
[im 48/143  lung]
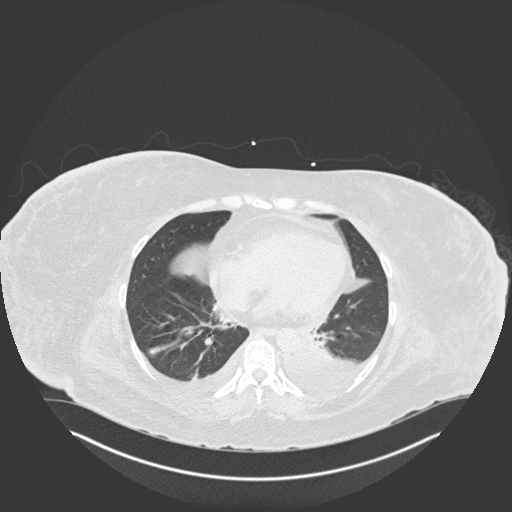
[im 58/143  mediastinal]
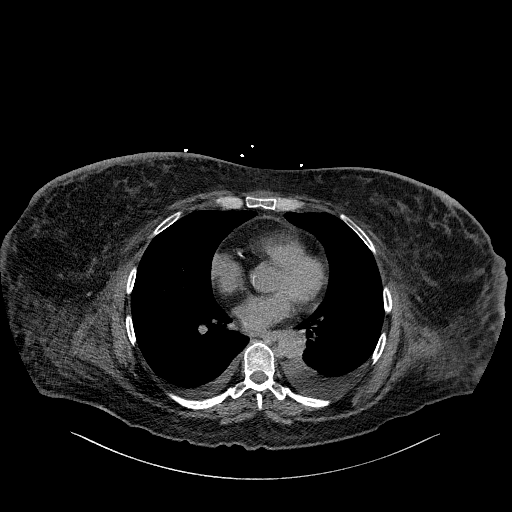
[im 58/143  lung]
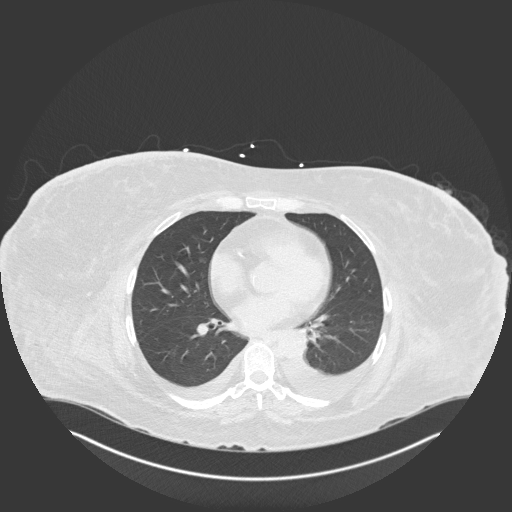
[im 74/143  lung]
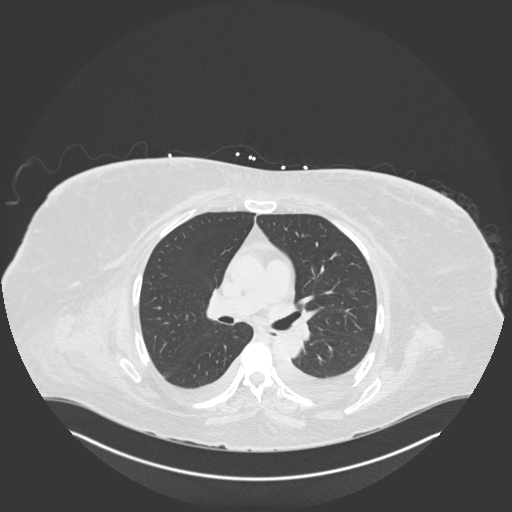
[im 85/143  lung]
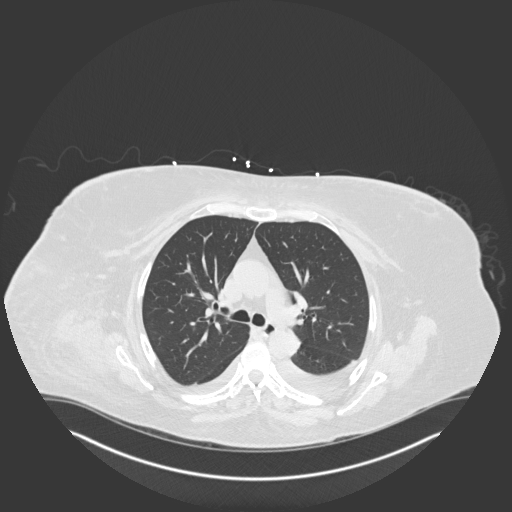
[im 95/143  lung]
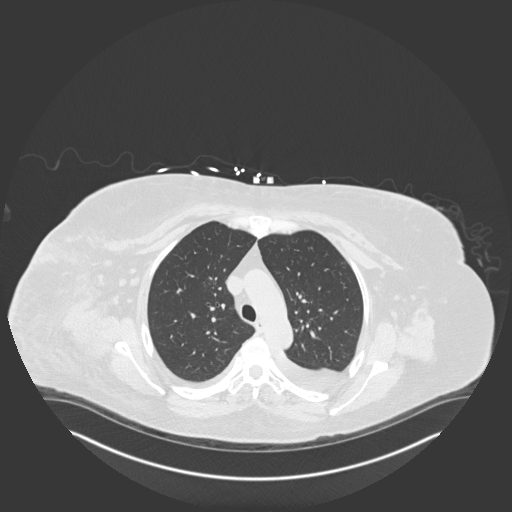
[im 111/143  mediastinal]
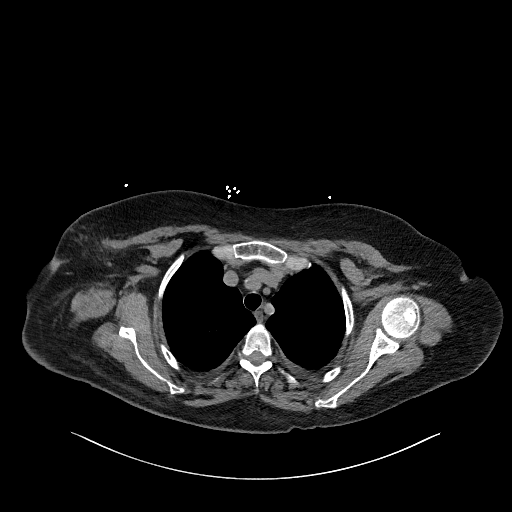
[im 111/143  lung]
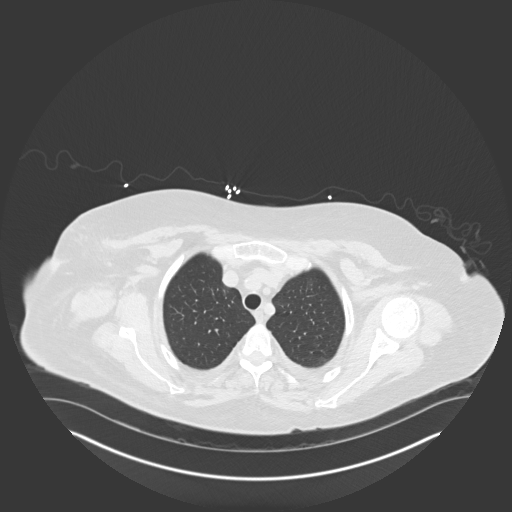
[im 121/143  lung]
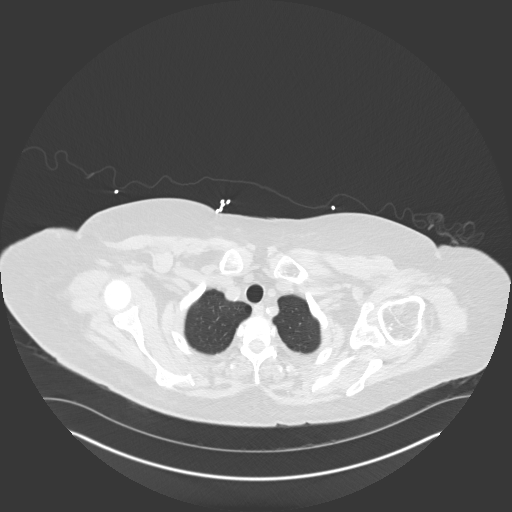
[im 132/143  lung]
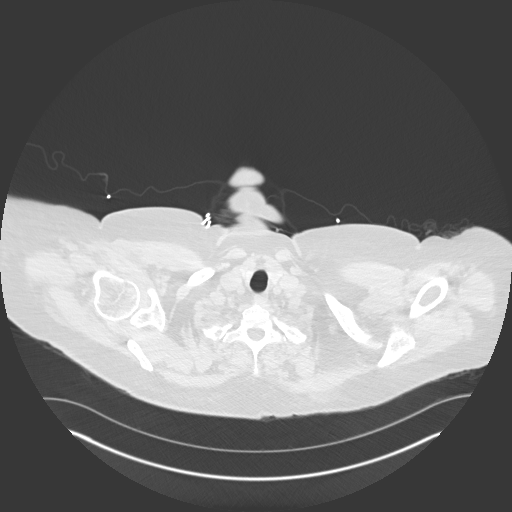

[Series 5: coronal · coronal · 0.56mm/px · 3 of 163 slices shown]
[im 33/163  lung]
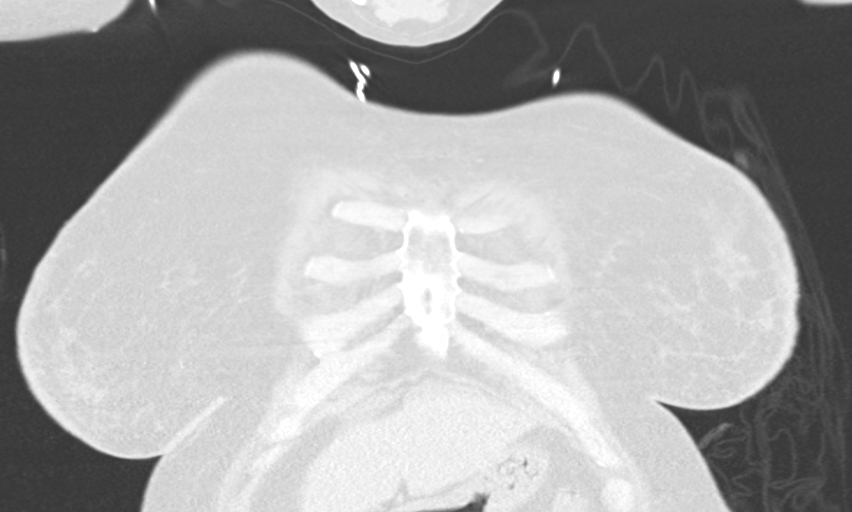
[im 65/163  lung]
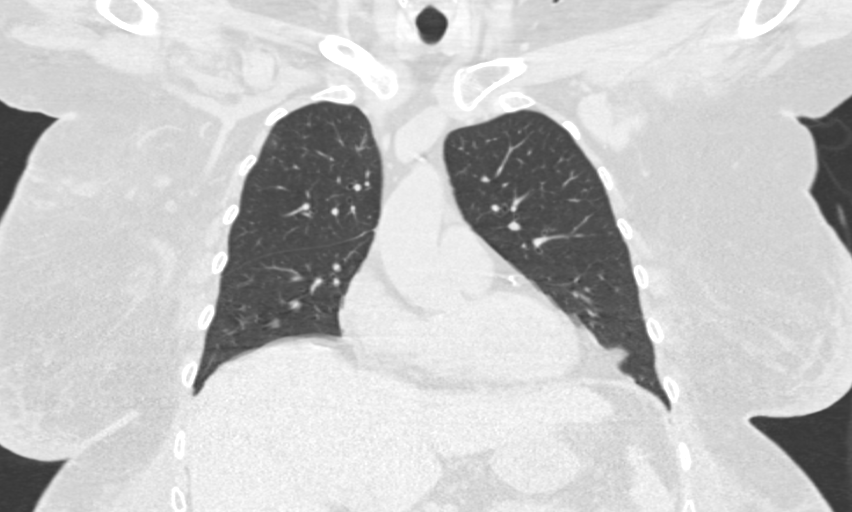
[im 98/163  lung]
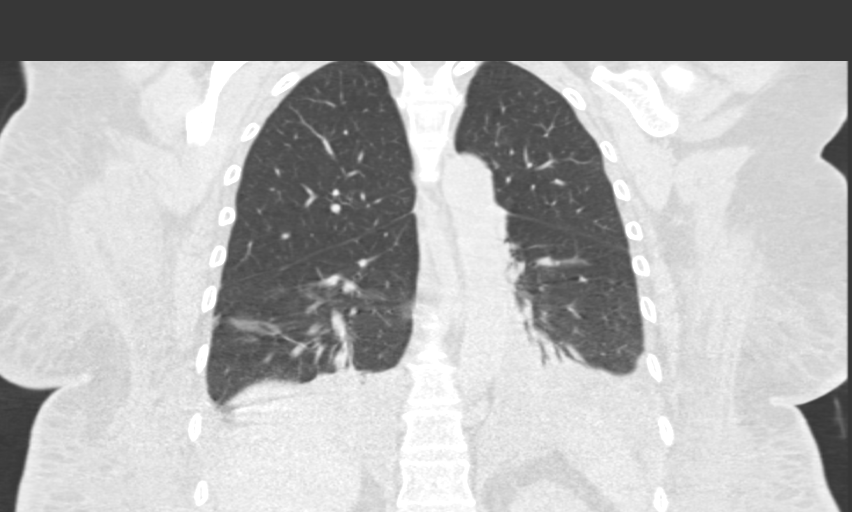

[14 of 36 positions shown; findings below may reference images not displayed]

FINDINGS: Cardiovascular: Coronary artery calcifications are identified. The
heart size is normal. The thoracic aorta demonstrates mild
atherosclerotic change. No aneurysm identified. Central pulmonary
arteries are normal in caliber.

Mediastinum/Nodes: Small bilateral pleural effusions are seen. A
tiny amount of pericardial fluid is noted. The thyroid and esophagus
are normal. No adenopathy in the mediastinum or hila. Increased
subcutaneous edema identified. There is skin thickening associated
with both breasts. No definitive abscess or mass is seen.

Lungs/Pleura: Central airways are normal. No pneumothorax. No overt
edema. Atelectasis underlies the bilateral effusions. No suspicious
infiltrate to suggest pneumonia. No suspicious nodules or masses.

Upper Abdomen: No acute abnormality.

Musculoskeletal: No chest wall mass or suspicious bone lesions
identified.
IMPRESSION: 1. The patient's symptoms of breast pain with redness and drainage
could be seen with edema, mastitis, or inflammatory breast cancer.
No obvious abscess or mass seen on today's imaging. However,
mammography and ultrasound would be the studies of choice rather
than CT. Recommend referral to the [REDACTED] for further
evaluation.
2. Pleural effusions and subcutaneous edema likely represent volume
overload. No pulmonary edema identified.
3. Coronary artery calcifications. Mild atherosclerotic change in
the thoracic aorta.
4. No other acute abnormalities.

Aortic Atherosclerosis (FO1RD-L59.9).
